# Patient Record
Sex: Male | Born: 1990 | Race: White | Hispanic: No | Marital: Single | Smoking: Current every day smoker
Health system: Southern US, Community
[De-identification: ages and names within clinical notes are randomized; demographics above are authoritative.]

## PROBLEM LIST (undated history)

## (undated) DIAGNOSIS — Z789 Other specified health status: Secondary | ICD-10-CM

## (undated) DIAGNOSIS — Z720 Tobacco use: Secondary | ICD-10-CM

## (undated) DIAGNOSIS — Z7289 Other problems related to lifestyle: Secondary | ICD-10-CM

---
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
## 2012-02-17 ENCOUNTER — Emergency Department: Payer: Self-pay | Admitting: Emergency Medicine

## 2012-02-17 ENCOUNTER — Emergency Department: Payer: Self-pay | Admitting: *Deleted

## 2014-09-25 ENCOUNTER — Emergency Department: Payer: Self-pay | Admitting: Emergency Medicine

## 2014-11-21 ENCOUNTER — Emergency Department
Admit: 2014-11-21 | Disposition: A | Discharge status: Home / Self Care | Payer: Self-pay | Admitting: Emergency Medicine

## 2014-11-21 LAB — CBC WITH DIFFERENTIAL/PLATELET
BASOS PCT: 0.4 %
Basophil #: 0 10*3/uL (ref 0.0–0.1)
Eosinophil #: 0.2 10*3/uL (ref 0.0–0.7)
Eosinophil %: 2.8 %
HCT: 43.4 % (ref 40.0–52.0)
HGB: 14.6 g/dL (ref 13.0–18.0)
LYMPHS PCT: 24.7 %
Lymphocyte #: 1.6 10*3/uL (ref 1.0–3.6)
MCH: 30.4 pg (ref 26.0–34.0)
MCHC: 33.7 g/dL (ref 32.0–36.0)
MCV: 90 fL (ref 80–100)
Monocyte #: 0.4 x10 3/mm (ref 0.2–1.0)
Monocyte %: 6.8 %
NEUTROS PCT: 65.3 %
Neutrophil #: 4.3 10*3/uL (ref 1.4–6.5)
Platelet: 298 10*3/uL (ref 150–440)
RBC: 4.81 10*6/uL (ref 4.40–5.90)
RDW: 12.6 % (ref 11.5–14.5)
WBC: 6.6 10*3/uL (ref 3.8–10.6)

## 2014-11-21 LAB — COMPREHENSIVE METABOLIC PANEL
ALK PHOS: 60 U/L
Albumin: 4.2 g/dL
Anion Gap: 7 (ref 7–16)
BILIRUBIN TOTAL: 0.4 mg/dL
BUN: 12 mg/dL
CHLORIDE: 104 mmol/L
CO2: 29 mmol/L
CREATININE: 0.77 mg/dL
Calcium, Total: 9.5 mg/dL
EGFR (African American): >60
Glucose: 115 mg/dL — ABNORMAL HIGH
POTASSIUM: 4 mmol/L
SGOT(AST): 29 U/L
SGPT (ALT): 20 U/L
SODIUM: 140 mmol/L
TOTAL PROTEIN: 7.6 g/dL

## 2014-11-21 LAB — URINALYSIS, COMPLETE
Bacteria: NONE SEEN
Bilirubin,UR: NEGATIVE
Blood: NEGATIVE
Glucose,UR: NEGATIVE mg/dL (ref 0–75)
KETONE: NEGATIVE
Leukocyte Esterase: NEGATIVE
Nitrite: NEGATIVE
PH: 6 (ref 4.5–8.0)
PROTEIN: NEGATIVE
RBC,UR: NONE SEEN /HPF (ref 0–5)
SPECIFIC GRAVITY: 1.014 (ref 1.003–1.030)
Squamous Epithelial: NONE SEEN
WBC UR: NONE SEEN /HPF (ref 0–5)

## 2014-12-11 ENCOUNTER — Emergency Department
Admit: 2014-12-11 | Disposition: A | Discharge status: Home / Self Care | Payer: Self-pay | Admitting: Emergency Medicine

## 2015-10-19 ENCOUNTER — Encounter: Payer: Self-pay | Admitting: Emergency Medicine

## 2015-10-19 ENCOUNTER — Emergency Department
Admission: EM | Admit: 2015-10-19 | Discharge: 2015-10-19 | Disposition: A | Discharge status: Home / Self Care | Payer: Worker's Compensation | Attending: Emergency Medicine | Admitting: Emergency Medicine

## 2015-10-19 ENCOUNTER — Emergency Department: Payer: Worker's Compensation

## 2015-10-19 DIAGNOSIS — S62617B Displaced fracture of proximal phalanx of left little finger, initial encounter for open fracture: Secondary | ICD-10-CM | POA: Diagnosis not present

## 2015-10-19 DIAGNOSIS — Y9389 Activity, other specified: Secondary | ICD-10-CM | POA: Insufficient documentation

## 2015-10-19 DIAGNOSIS — W312XXA Contact with powered woodworking and forming machines, initial encounter: Secondary | ICD-10-CM | POA: Diagnosis not present

## 2015-10-19 DIAGNOSIS — Y9289 Other specified places as the place of occurrence of the external cause: Secondary | ICD-10-CM | POA: Diagnosis not present

## 2015-10-19 DIAGNOSIS — S62609B Fracture of unspecified phalanx of unspecified finger, initial encounter for open fracture: Secondary | ICD-10-CM

## 2015-10-19 DIAGNOSIS — F17210 Nicotine dependence, cigarettes, uncomplicated: Secondary | ICD-10-CM | POA: Insufficient documentation

## 2015-10-19 DIAGNOSIS — Y990 Civilian activity done for income or pay: Secondary | ICD-10-CM | POA: Diagnosis not present

## 2015-10-19 DIAGNOSIS — S61217A Laceration without foreign body of left little finger without damage to nail, initial encounter: Secondary | ICD-10-CM | POA: Diagnosis present

## 2015-10-19 DIAGNOSIS — IMO0002 Reserved for concepts with insufficient information to code with codable children: Secondary | ICD-10-CM

## 2015-10-19 MED ORDER — LIDOCAINE HCL (PF) 1 % IJ SOLN
INTRAMUSCULAR | Status: AC
  Administered 2015-10-19: 04:00:00
  Filled 2015-10-19: qty 5

## 2015-10-19 MED ORDER — CEPHALEXIN 500 MG PO CAPS: 500.0000 mg | ORAL_CAPSULE | Freq: Four times a day (QID) | ORAL | Status: DC

## 2015-10-19 MED ORDER — CEPHALEXIN 500 MG PO CAPS
500.0000 mg | ORAL_CAPSULE | Freq: Once | ORAL | Status: AC
  Administered 2015-10-19: 500 mg via ORAL
  Filled 2015-10-19: qty 1

## 2015-10-19 NOTE — ED Notes (Signed)
Pt discharged to home.  Friend driving.  Discharge instructions reviewed.  Verbalized understanding.  No questions or concerns at this time.  Teach back verified.  Pt in NAD.  No items left in ED.   

## 2015-10-19 NOTE — ED Notes (Signed)
Pt presents to ED with laceration to his left hand on the 5th digit. 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 States he does not want to file workman's comp. Holding towel to laceration at this time. Bleeding not controlled at this time.

## 2015-10-19 NOTE — Discharge Instructions (Signed)
1. Take antibiotic as prescribed (Keflex 500 mg 4 times daily 7 days). 2. Suture removal in 7-10 days. 3. Return to the ER for worsening symptoms, increased redness, swelling, purulent discharge or other concerns.  Finger Fracture Fractures of fingers are breaks in the bones of the fingers. There are many types of fractures. There are different ways of treating these fractures. Your health care provider will discuss the best way to treat your fracture. CAUSES Traumatic injury is the main cause of broken fingers. These include:  Injuries while playing sports.  Workplace injuries.  Falls. RISK FACTORS Activities that can increase your risk of finger fractures include:  Sports.  Workplace activities that involve machinery.  A condition called osteoporosis, which can make your bones less dense and cause them to fracture more easily. SIGNS AND SYMPTOMS The main symptoms of a broken finger are pain and swelling within 15 minutes after the injury. Other symptoms include:  Bruising of your finger.  Stiffness of your finger.  Numbness of your finger.  Exposed bones (compound fracture) if the fracture is severe. DIAGNOSIS  The best way to diagnose a broken bone is with X-ray imaging. Additionally, your health care provider will use this X-ray image to evaluate the position of the broken finger bones.  TREATMENT  Finger fractures can be treated with:   Nonreduction--This means the bones are in place. The finger is splinted without changing the positions of the bone pieces. The splint is usually left on for about a week to 10 days. This will depend on your fracture and what your health care provider thinks.  Closed reduction--The bones are put back into position without using surgery. The finger is then splinted.  Open reduction and internal fixation--The fracture site is opened. Then the bone pieces are fixed into place with pins or some type of hardware. This is seldom required. It  depends on the severity of the fracture. HOME CARE INSTRUCTIONS   Follow your health care provider's instructions regarding activities, exercises, and physical therapy.  Only take over-the-counter or prescription medicines for pain, discomfort, or fever as directed by your health care provider. SEEK MEDICAL CARE IF: You have pain or swelling that limits the motion or use of your fingers. SEEK IMMEDIATE MEDICAL CARE IF:  Your finger becomes numb. MAKE SURE YOU:   Understand these instructions.  Will watch your condition.  Will get help right away if you are not doing well or get worse.   This information is not intended to replace advice given to you by your health care provider. Make sure you discuss any questions you have with your health care provider.   Document Released: 11/12/2000 Document Revised: 05/21/2013 Document Reviewed: 03/12/2013 Elsevier Interactive Patient Education [employer]2016 Elsevier Inc.

## 2015-10-19 NOTE — ED Provider Notes (Signed)
[facility] Emergency Department Provider Note  ____________________________________________  Time seen: Approximately 2:37 AM  I have reviewed the triage vital signs and the nursing notes.   HISTORY  Chief Complaint Extremity Laceration    HPI Brent Stone is a 25 y.o. male who presents to the ED from work for left finger laceration. Patient is left-handed, states he was cutting yarn on a small saw blade and slipped. Tetanus is up-to-date. Complains of pain on movement and bleeding. Denies numbness/tingling. Voices no other medical complaints.   Past medical history None  There are no active problems to display for this patient.   History reviewed. No pertinent past surgical history.  No current outpatient prescriptions on file.  Allergies Review of patient's allergies indicates no known allergies.  No family history on file.  Social History Social History  Substance Use Topics  . Smoking status: Current Every Day Smoker -- 1.00 packs/day    Types: Cigarettes  . Smokeless tobacco: None  . Alcohol Use: Yes    Review of Systems  Constitutional: No fever/chills. Eyes: No visual changes. ENT: No sore throat. Cardiovascular: Denies chest pain. Respiratory: Denies shortness of breath. Gastrointestinal: No abdominal pain.  No nausea, no vomiting.  No diarrhea.  No constipation. Genitourinary: Negative for dysuria. Musculoskeletal: Positive for left finger laceration. Negative for back pain. Skin: Negative for rash. Neurological: Negative for headaches, focal weakness or numbness.  10-point ROS otherwise negative.  ____________________________________________   PHYSICAL EXAM:  VITAL SIGNS: ED Triage Vitals  Enc Vitals Group     BP 10/19/15 0213 127/112 mmHg     Pulse Rate 10/19/15 0213 85     Resp 10/19/15 0213 18     Temp 10/19/15 0213 97.7 F (36.5 C)     Temp Source 10/19/15 0213 Oral     SpO2 10/19/15 0213 98 %     Weight  10/19/15 0213 170 lb (77.111 kg)     Height 10/19/15 0213 5\' 6"  (1.676 m)     Head Cir --      Peak Flow --      Pain Score 10/19/15 0213 6     Pain Loc --      Pain Edu? --      Excl. in GC? --     Constitutional: Alert and oriented. Well appearing and in mild acute distress. Eyes: Conjunctivae are normal. PERRL. EOMI. Head: Atraumatic. Nose: No congestion/rhinnorhea. Mouth/Throat: Mucous membranes are moist.  Oropharynx non-erythematous. Neck: No stridor.   Cardiovascular: Normal rate, regular rhythm. Grossly normal heart sounds.  Good peripheral circulation. Respiratory: Normal respiratory effort.  No retractions. Lungs CTAB. Gastrointestinal: Soft and nontender. No distention. No abdominal bruits. No CVA tenderness. Musculoskeletal:  Left Ring finger: Approximately 1 cm curve, deep laceration which involves dorsal and medial finger beneath IP joint. Does not actually involve the joint. Venous bleeding. No tendon visualized. No arterial bleeding. Full range of motion. Brisk, less than 5 second capillary refill. 2+ radial pulses. Neurologic:  Normal speech and language. No gross focal neurologic deficits are appreciated. No gait instability. Skin:  Skin is warm, dry and intact. No rash noted. Psychiatric: Mood and affect are normal. Speech and behavior are normal.  ____________________________________________   LABS (all labs ordered are listed, but only abnormal results are displayed)  Labs Reviewed - No data to display ____________________________________________  EKG  None ____________________________________________  RADIOLOGY  Left Ring finger x-ray (viewed by me, interpreted per Dr. Manus GunningEhinger): Soft tissue laceration with minimally displaced  and comminuted fracture of the fifth proximal phalanx. ____________________________________________   PROCEDURES  Procedure(s) performed:   LACERATION REPAIR Performed by: Irean Hong Authorized by: Irean Hong Consent:  Verbal consent obtained. Risks and benefits: risks, benefits and alternatives were discussed Consent given by: patient Patient identity confirmed: provided demographic data Prepped and Draped in normal sterile fashion Wound explored  Laceration Location: left 5th digit  Laceration Length: 1cm  No Foreign Bodies seen or palpated  Anesthesia: local infiltration  Local anesthetic: lidocaine 1% w/o epinephrine  Anesthetic total: 6 ml  Irrigation method: syringe; copious irrigation Amount of cleaning: standard  Skin closure: 4-0 nylon  Number of sutures: continuous  Technique: standard sterile  Patient tolerance: Patient tolerated the procedure well with no immediate complications.  Surgicel and direct pressure applied to wound after suturing secondary to continued venous oozing. Removed approximately 5 minutes later with good hemostasis; no active bleeding.  Critical Care performed: No  ____________________________________________   INITIAL IMPRESSION / ASSESSMENT AND PLAN / ED COURSE  Pertinent labs & imaging results that were available during my care of the patient were reviewed by me and considered in my medical decision making (see chart for details).  25 year old male who presents with left ring finger laceration secondary to saw blade. Patient was extensively irrigated after digital block and sutured; tolerated procedure well with good hemostasis. Will place on Keflex and follow-up with orthopedics. Strict return precautions given. Patient verbalizes understanding and agrees with plan of care. ____________________________________________   FINAL CLINICAL IMPRESSION(S) / ED DIAGNOSES  Final diagnoses:  Laceration  Finger fracture, left, open, initial encounter      Irean Hong, MD 10/19/15 [phone]

## 2015-10-20 ENCOUNTER — Emergency Department
Admission: EM | Admit: 2015-10-20 | Disposition: A | Discharge status: Home / Self Care | Payer: Self-pay | Source: Home / Self Care

## 2015-10-20 NOTE — ED Notes (Signed)
Went to the waiting room to get the patient and was told he was sent to outpatient. Patient moved back into the waiting room.

## 2015-10-22 ENCOUNTER — Emergency Department
Admission: EM | Admit: 2015-10-22 | Discharge: 2015-10-22 | Disposition: A | Discharge status: Home / Self Care | Payer: Worker's Compensation | Attending: Emergency Medicine | Admitting: Emergency Medicine

## 2015-10-22 ENCOUNTER — Encounter: Payer: Self-pay | Admitting: Emergency Medicine

## 2015-10-22 DIAGNOSIS — Z5189 Encounter for other specified aftercare: Secondary | ICD-10-CM

## 2015-10-22 DIAGNOSIS — Z4801 Encounter for change or removal of surgical wound dressing: Secondary | ICD-10-CM | POA: Insufficient documentation

## 2015-10-22 DIAGNOSIS — Z760 Encounter for issue of repeat prescription: Secondary | ICD-10-CM | POA: Diagnosis present

## 2015-10-22 DIAGNOSIS — F17210 Nicotine dependence, cigarettes, uncomplicated: Secondary | ICD-10-CM | POA: Diagnosis not present

## 2015-10-22 MED ORDER — TRAMADOL HCL 50 MG PO TABS: 50.0000 mg | ORAL_TABLET | Freq: Two times a day (BID) | ORAL | Status: DC

## 2015-10-22 NOTE — ED Provider Notes (Signed)
[facility] Emergency Department Provider Note ____________________________________________  Time seen: 1723  I have reviewed the triage vital signs and the nursing notes.  HISTORY  Chief Complaint  Medication Refill  HPI Brent Stone is a 25 y.o. male presents to the ED 3 days status post evaluation on the date of injury where he sustained a left pinky laceration and open fracture to the same following a work injury. He was treated with suture repair and wound dressing. He was discharged with a prescription for Keflex for management of the open fracture. Patient does endorse that he is scheduled to see orthopedics on Monday at 3:15 in the afternoon. He reports he's been dosing the Keflex as prescribed, and has been dosing appropriate without significant pain relief. He is here today requesting a prescription for pain management.He denies any wound drainage or interim fever chills or sweats. He does admit that he was unable to return to work the last day or two secondary to his pain. Patient has dressed the wound with a gauze and 2 rubber bands, citing he ran out of the tape that was used to dress the wound originally. He rates his pain a 9/10 in triage.  History reviewed. No pertinent past medical history.  There are no active problems to display for this patient.   History reviewed. No pertinent past surgical history.  Current Outpatient Rx  Name  Route  Sig  Dispense  Refill  . cephALEXin (KEFLEX) 500 MG capsule   Oral   Take 1 capsule (500 mg total) by mouth 4 (four) times daily.   28 capsule   0   . traMADol (ULTRAM) 50 MG tablet   Oral   Take 1 tablet (50 mg total) by mouth 2 (two) times daily.   10 tablet   0    Allergies Review of patient's allergies indicates no known allergies.  No family history on file.  Social History Social History  Substance Use Topics  . Smoking status: Current Every Day Smoker -- 1.00 packs/day    Types:  Cigarettes  . Smokeless tobacco: None  . Alcohol Use: Yes   Review of Systems  Constitutional: Negative for fever. Cardiovascular: Negative for chest pain. Respiratory: Negative for shortness of breath. Musculoskeletal: Negative for back pain. Left pinky pain as above Skin: Negative for rash. Neurological: Negative for headaches, focal weakness or numbness. ____________________________________________  PHYSICAL EXAM:  VITAL SIGNS: ED Triage Vitals  Enc Vitals Group     BP 10/22/15 1642 151/85 mmHg     Pulse Rate 10/22/15 1642 101     Resp 10/22/15 1642 18     Temp 10/22/15 1642 97.9 F (36.6 C)     Temp Source 10/22/15 1642 Oral     SpO2 10/22/15 1642 98 %     Weight 10/22/15 1642 170 lb (77.111 kg)     Height 10/22/15 1642 5\' 6"  (1.676 m)     Head Cir --      Peak Flow --      Pain Score 10/22/15 1642 9     Pain Loc --      Pain Edu? --      Excl. in GC? --    Constitutional: Alert and oriented. Well appearing and in no distress. Head: Normocephalic and atraumatic. Eyes: Conjunctivae are normal. PERRL. Normal extraocular movements Hematological/Lymphatic/Immunological: No cervical lymphadenopathy. Cardiovascular: Normal rate, regular rhythm. Normal distal pulses normal capillary refill. Respiratory: Normal respiratory effort.  Musculoskeletal: Left pinky with normal flexion  and extension range on exam. Patient with a healing linear laceration to the palmar & lateral aspect of the left pinky. No wound dehiscence is appreciated, black nylon sutures are intact. Nontender with normal range of motion in all extremities.  Neurologic: Normal gross sensation. Cranial nerves II through XII grossly intact. Normal gait without ataxia. Normal speech and language. No gross focal neurologic deficits are appreciated. Skin:  Skin is warm, dry and intact. No rash noted. Psychiatric: Mood and affect are normal. Patient exhibits appropriate insight and  judgment. ____________________________________________  PROCEDURES  Wound dressing  Finger splint ____________________________________________  INITIAL IMPRESSION / ASSESSMENT AND PLAN / ED COURSE  Patient returns for a wound check 3 days status post left pinky laceration status post suture repair. The wound was again cleansed and dressed with a nonstick dressing and a finger splint for support. Patient will follow-up with orthopedics as scheduled for Monday afternoon. He continued As previously prescribed. He is also given a prescription for Ultram #10 to dose as needed for pain. Again were reviewed be released to modified duty is available for left hand use as tolerated, and instructions on keeping the wound, splint, and dressing clean and dry. ____________________________________________  FINAL CLINICAL IMPRESSION(S) / ED DIAGNOSES  Final diagnoses:  Visit for wound check      Lissa Hoard, PA-C 10/22/15 1753  Lissa Hoard, PA-C 10/22/15 1753  Governor Rooks, MD 10/22/15 2057

## 2015-10-22 NOTE — Discharge Instructions (Signed)
Wound Care Taking care of your wound properly can help to prevent pain and infection. It can also help your wound to heal more quickly.  HOW TO CARE FOR YOUR WOUND  Take or apply over-the-counter and prescription medicines only as told by your health care provider.  If you were prescribed antibiotic medicine, take or apply it as told by your health care provider. Do not stop using the antibiotic even if your condition improves.  Clean the wound each day or as told by your health care provider.  Wash the wound with mild soap and water.  Rinse the wound with water to remove all soap.  Pat the wound dry with a clean towel. Do not rub it.  There are many different ways to close and cover a wound. For example, a wound can be covered with stitches (sutures), skin glue, or adhesive strips. Follow instructions from your health care provider about:  How to take care of your wound.  When and how you should change your bandage (dressing).  When you should remove your dressing.  Removing whatever was used to close your wound.  Check your wound every day for signs of infection. Watch for:  Redness, swelling, or pain.  Fluid, blood, or pus.  Keep the dressing dry until your health care provider says it can be removed. Do not take baths, swim, use a hot tub, or do anything that would put your wound underwater until your health care provider approves.  Raise (elevate) the injured area above the level of your heart while you are sitting or lying down.  Do not scratch or pick at the wound.  Keep all follow-up visits as told by your health care provider. This is important. SEEK MEDICAL CARE IF:  You received a tetanus shot and you have swelling, severe pain, redness, or bleeding at the injection site.  You have a fever.  Your pain is not controlled with medicine.  You have increased redness, swelling, or pain at the site of your wound.  You have fluid, blood, or pus coming from your  wound.  You notice a bad smell coming from your wound or your dressing. SEEK IMMEDIATE MEDICAL CARE IF:  You have a red streak going away from your wound.   This information is not intended to replace advice given to you by your health care provider. Make sure you discuss any questions you have with your health care provider.   Document Released: 05/09/2008 Document Revised: 12/15/2014 Document Reviewed: 07/27/2014 Elsevier Interactive Patient Education [employer]   Your wound is healing well. Keep the wound and splint clean and dry. Follow-up with ortho as planned on Monday. Continue the antibiotic as directed. Take the pain medicine as needed.

## 2015-10-22 NOTE — ED Notes (Signed)
Patient was seen here earlier this week for laceration to left 5th finger. Patient had 8-9 stitches placed. 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 Patient wanted to be seen for pain control related to laceration.

## 2015-12-29 ENCOUNTER — Encounter: Payer: Self-pay | Admitting: Emergency Medicine

## 2015-12-29 ENCOUNTER — Emergency Department
Admission: EM | Admit: 2015-12-29 | Discharge: 2015-12-29 | Disposition: A | Discharge status: Home / Self Care | Payer: Self-pay | Attending: Emergency Medicine | Admitting: Emergency Medicine

## 2015-12-29 DIAGNOSIS — F17210 Nicotine dependence, cigarettes, uncomplicated: Secondary | ICD-10-CM | POA: Insufficient documentation

## 2015-12-29 DIAGNOSIS — J309 Allergic rhinitis, unspecified: Secondary | ICD-10-CM | POA: Insufficient documentation

## 2015-12-29 MED ORDER — LORATADINE 10 MG PO TABS
10.0000 mg | ORAL_TABLET | Freq: Every day | ORAL | Status: DC
Start: 2015-12-29 — End: 2016-05-28

## 2015-12-29 MED ORDER — FLUTICASONE PROPIONATE 50 MCG/ACT NA SUSP
2.0000 | Freq: Every day | NASAL | Status: DC
Start: 2015-12-29 — End: 2016-05-28

## 2015-12-29 NOTE — Discharge Instructions (Signed)
Hay Fever Hay fever is an allergic reaction to particles in the air. It cannot be passed from person to person. It cannot be cured, but it can be controlled. CAUSES  Hay fever is caused by something that triggers an allergic reaction (allergens). The following are examples of allergens:  Ragweed.  Feathers.  Animal dander.  Grass and tree pollens.  Cigarette smoke.  House dust.  Pollution. SYMPTOMS   Sneezing.  Runny or stuffy nose.  Tearing eyes.  Itchy eyes, nose, mouth, throat, skin, or other area.  Sore throat.  Headache.  Decreased sense of smell or taste. DIAGNOSIS Your caregiver will perform a physical exam and ask questions about the symptoms you are having.Allergy testing may be done to determine exactly what triggers your hay fever.  TREATMENT   Over-the-counter medicines may help symptoms. These include:  Antihistamines.  Decongestants. These may help with nasal congestion.  Your caregiver may prescribe medicines if over-the-counter medicines do not work.  Some people benefit from allergy shots when other medicines are not helpful. HOME CARE INSTRUCTIONS   Avoid the allergen that is causing your symptoms, if possible.  Take all medicine as told by your caregiver. SEEK MEDICAL CARE IF:   You have severe allergy symptoms and your current medicines are not helping.  Your treatment was working at one time, but you are now experiencing symptoms.  You have sinus congestion and pressure.  You develop a fever or headache.  You have thick nasal discharge.  You have asthma and have a worsening cough and wheezing. SEEK IMMEDIATE MEDICAL CARE IF:   You have swelling of your tongue or lips.  You have trouble breathing.  You feel lightheaded or like you are going to faint.  You have cold sweats.  You have a fever.   This information is not intended to replace advice given to you by your health care provider. Make sure you discuss any  questions you have with your health care provider.   Document Released: 07/31/2005 Document Revised: 10/23/2011 Document Reviewed: 02/10/2015 Elsevier Interactive Patient Education 2016 Elsevier Inc.  

## 2015-12-29 NOTE — ED Notes (Signed)
Pt with cold sx started yesterday.

## 2015-12-29 NOTE — ED Notes (Signed)
States he developed some body aches and weakness for a couple of days   No fever  Or n/v  Lungs clear and abd soft    Afebrile on arrival

## 2015-12-29 NOTE — ED Provider Notes (Signed)
[facility] Emergency Department Provider Note  ____________________________________________  Time seen: Approximately 11:48 AM  I have reviewed the triage vital signs and the nursing notes.   HISTORY  Chief Complaint URI    HPI Brent Stone is a 25 y.o. male , NAD, presents to the emergency room 1 day history of sneezing, nasal congestion, runny nose. States he felt some body aches today. Took NyQuil last night without any alleviation of symptoms. Has not taken anything else over-the-counter for his symptoms. Has not had any fever, chills, nausea, vomiting, abdominal pain.Denies ear pain, sore throat, headache, sinus pressure or pain.   History reviewed. No pertinent past medical history.  There are no active problems to display for this patient.   History reviewed. No pertinent past surgical history.  Current Outpatient Rx  Name  Route  Sig  Dispense  Refill  . fluticasone (FLONASE) 50 MCG/ACT nasal spray   Each Nare   Place 2 sprays into both nostrils daily.   16 g   0   . loratadine (CLARITIN) 10 MG tablet   Oral   Take 1 tablet (10 mg total) by mouth daily.   30 tablet   0     Allergies Review of patient's allergies indicates no known allergies.  No family history on file.  Social History Social History  Substance Use Topics  . Smoking status: Current Every Day Smoker -- 1.00 packs/day    Types: Cigarettes  . Smokeless tobacco: None  . Alcohol Use: Yes     Review of Systems  Constitutional: No fever/chills Eyes: No visual changes. No discharge, Redness, swelling ENT: Positive nasal congestion, runny nose, sneezing. No sore throat, ear pain, sinus pressure. Cardiovascular: No chest pain. Respiratory: No cough, chest congestion. No shortness of breath. No wheezing.  Gastrointestinal: No abdominal pain.  No nausea, vomiting.  No diarrhea.   Musculoskeletal: Positive general myalgias.  Skin: Negative for rash. Neurological:  Negative for headaches, focal weakness or numbness. 10-point ROS otherwise negative.  ____________________________________________   PHYSICAL EXAM:  VITAL SIGNS: ED Triage Vitals  Enc Vitals Group     BP 12/29/15 1133 126/84 mmHg     Pulse Rate 12/29/15 1133 74     Resp 12/29/15 1133 20     Temp 12/29/15 1133 97.8 F (36.6 C)     Temp Source 12/29/15 1133 Oral     SpO2 12/29/15 1133 100 %     Weight 12/29/15 1133 170 lb (77.111 kg)     Height 12/29/15 1133 5\' 6"  (1.676 m)     Head Cir --      Peak Flow --      Pain Score --      Pain Loc --      Pain Edu? --      Excl. in GC? --      Constitutional: Alert and oriented. Well appearing and in no acute distress. Eyes: Conjunctivae are normal. PERRLA. EOMI without pain.  Head: Atraumatic. ENT:      Ears: TMs could not be visualized due to dark, dry, impacted cerumen in bilateral canals.      Nose: Mild congestion with moderate clear rhinnorhea. Turbinates are injected.      Mouth/Throat: Mucous membranes are moist. Pharynx without erythema, swelling, exudate. Clear postnasal drip. Neck:  Supple with full range of motion Hematological/Lymphatic/Immunilogical: No cervical lymphadenopathy. Cardiovascular: Normal rate, regular rhythm. Normal S1 and S2.  Good peripheral circulation. Respiratory: Normal respiratory effort without tachypnea or retractions.  Lungs CTAB with breath sounds noted in all lung fields. Neurologic:  Normal speech and language. No gross focal neurologic deficits are appreciated.  Skin:  Skin is warm, dry and intact. No rash noted. Psychiatric: Mood and affect are normal. Speech and behavior are normal. Patient exhibits appropriate insight and judgement.   ____________________________________________    LABS  None ____________________________________________  EKG  None ____________________________________________  RADIOLOGY  None ____________________________________________    PROCEDURES  Procedure(s) performed: None    Medications - No data to display   ____________________________________________   INITIAL IMPRESSION / ASSESSMENT AND PLAN / ED COURSE  Patient's diagnosis is consistent with allergic rhinitis. Patient will be discharged home with prescriptions for Flonase and Claritin to take as directed. Patient may take Tylenol or ibuprofen as needed for aches. Patient is to follow up with a local outpatient primary care if symptoms persist past this treatment course. Patient was given information in regards to [facility] [place], [facility] and the [facility] to establish care. Patient is given ED precautions to return to the ED for any worsening or new symptoms.    ____________________________________________  FINAL CLINICAL IMPRESSION(S) / ED DIAGNOSES  Final diagnoses:  Allergic rhinitis, unspecified allergic rhinitis type      NEW MEDICATIONS STARTED DURING THIS VISIT:  Discharge Medication List as of 12/29/2015 11:57 AM    START taking these medications   Details  fluticasone (FLONASE) 50 MCG/ACT nasal spray Place 2 sprays into both nostrils daily., Starting 12/29/2015, Until Discontinued, Print    loratadine (CLARITIN) 10 MG tablet Take 1 tablet (10 mg total) by mouth daily., Starting 12/29/2015, Until Discontinued, Print             Hope Pigeon, PA-C 12/29/15 1209  Myrna Blazer, MD 12/29/15 [phone]

## 2016-01-28 ENCOUNTER — Emergency Department: Payer: Self-pay

## 2016-01-28 ENCOUNTER — Encounter: Payer: Self-pay | Admitting: Emergency Medicine

## 2016-01-28 ENCOUNTER — Emergency Department
Admission: EM | Admit: 2016-01-28 | Discharge: 2016-01-28 | Disposition: A | Discharge status: Home / Self Care | Payer: Self-pay | Attending: Emergency Medicine | Admitting: Emergency Medicine

## 2016-01-28 DIAGNOSIS — F17210 Nicotine dependence, cigarettes, uncomplicated: Secondary | ICD-10-CM | POA: Insufficient documentation

## 2016-01-28 DIAGNOSIS — M25561 Pain in right knee: Secondary | ICD-10-CM | POA: Insufficient documentation

## 2016-01-28 MED ORDER — IBUPROFEN 800 MG PO TABS: 800.0000 mg | ORAL_TABLET | Freq: Three times a day (TID) | ORAL | Status: DC | PRN
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
## 2016-01-28 NOTE — Discharge Instructions (Signed)
Knee Pain Knee pain is a very common symptom and can have many causes. Knee pain often goes away when you follow your health care provider's instructions for relieving pain and discomfort at home. However, knee pain can develop into a condition that needs treatment. Some conditions may include:  Arthritis caused by wear and tear (osteoarthritis).  Arthritis caused by swelling and irritation (rheumatoid arthritis or gout).  A cyst or growth in your knee.  An infection in your knee joint.  An injury that will not heal.  Damage, swelling, or irritation of the tissues that support your knee (torn ligaments or tendinitis). If your knee pain continues, additional tests may be ordered to diagnose your condition. Tests may include X-rays or other imaging studies of your knee. You may also need to have fluid removed from your knee. Treatment for ongoing knee pain depends on the cause, but treatment may include:  Medicines to relieve pain or swelling.  Steroid injections in your knee.  Physical therapy.  Surgery. HOME CARE INSTRUCTIONS  Take medicines only as directed by your health care provider.  Rest your knee and keep it raised (elevated) while you are resting.  Do not do things that cause or worsen pain.  Avoid high-impact activities or exercises, such as running, jumping rope, or doing jumping jacks.  Apply ice to the knee area:  Put ice in a plastic bag.  Place a towel between your skin and the bag.  Leave the ice on for 20 minutes, 2-3 times a day.  Ask your health care provider if you should wear an elastic knee support.  Keep a pillow under your knee when you sleep.  Lose weight if you are overweight. Extra weight can put pressure on your knee.  Do not use any tobacco products, including cigarettes, chewing tobacco, or electronic cigarettes. If you need help quitting, ask your health care provider. Smoking may slow the healing of any bone and joint problems that you may  have. SEEK MEDICAL CARE IF:  Your knee pain continues, changes, or gets worse.  You have a fever along with knee pain.  Your knee buckles or locks up.  Your knee becomes more swollen. SEEK IMMEDIATE MEDICAL CARE IF:   Your knee joint feels hot to the touch.  You have chest pain or trouble breathing.   This information is not intended to replace advice given to you by your health care provider. Make sure you discuss any questions you have with your health care provider.   Document Released: 05/28/2007 Document Revised: 08/21/2014 Document Reviewed: 03/16/2014 Elsevier Interactive Patient Education 2016 [employer]   You can take ibuprofen as needed for knee pain. It is unclear what is triggering the pain. It is likely due to a recurrent knee strain.  You  may purchase a knee brace from a local pharmacy for support. He can also follow-up with the orthopedist for further evaluation.

## 2016-02-25 ENCOUNTER — Emergency Department
Admission: EM | Admit: 2016-02-25 | Discharge: 2016-02-25 | Disposition: A | Discharge status: Home / Self Care | Payer: Self-pay | Attending: Emergency Medicine | Admitting: Emergency Medicine

## 2016-02-25 DIAGNOSIS — T148XXA Other injury of unspecified body region, initial encounter: Secondary | ICD-10-CM

## 2016-02-25 DIAGNOSIS — Y929 Unspecified place or not applicable: Secondary | ICD-10-CM | POA: Insufficient documentation

## 2016-02-25 DIAGNOSIS — S61001A Unspecified open wound of right thumb without damage to nail, initial encounter: Secondary | ICD-10-CM | POA: Insufficient documentation

## 2016-02-25 DIAGNOSIS — W260XXA Contact with knife, initial encounter: Secondary | ICD-10-CM | POA: Insufficient documentation

## 2016-02-25 DIAGNOSIS — Y9389 Activity, other specified: Secondary | ICD-10-CM | POA: Insufficient documentation

## 2016-02-25 DIAGNOSIS — F17210 Nicotine dependence, cigarettes, uncomplicated: Secondary | ICD-10-CM | POA: Insufficient documentation

## 2016-02-25 DIAGNOSIS — Y999 Unspecified external cause status: Secondary | ICD-10-CM | POA: Insufficient documentation

## 2016-02-25 NOTE — ED Notes (Signed)
Pt states Tuesday he cut his rt thumb with a knife, pt states that he is concerned that it may be infected due to the drainage

## 2016-02-25 NOTE — ED Provider Notes (Signed)
CSN: [id number]     Arrival date & time 02/25/16  1701 History   First MD Initiated Contact with Patient 02/25/16 1718     Chief Complaint  Patient presents with  . Laceration     (Consider location/radiation/quality/duration/timing/severity/associated sxs/prior Treatment) HPI  25 year old male presents for his father for evaluation of right thumb soft tissue avulsion. Patient states 3 days ago he was cutting vegetables, cut the tip of his thumb. Patient placed a Band-Aid to the area and has been keeping it covered clean and dry. Denies any warmth erythema or drainage. No tingling. Patient states his employer made him come in today to have it checked out before he could return to work. His tetanus is up-to-date. He is not taking any medications for pain. Pain is 0 out of 10.  No past medical history on file. No past surgical history on file. No family history on file. Social History  Substance Use Topics  . Smoking status: Current Every Day Smoker -- 1.00 packs/day    Types: Cigarettes  . Smokeless tobacco: Not on file  . Alcohol Use: Yes    Review of Systems  Constitutional: Negative.  Negative for fever, chills, activity change and appetite change.  HENT: Negative for congestion, ear pain, mouth sores, rhinorrhea, sinus pressure, sore throat and trouble swallowing.   Eyes: Negative for photophobia, pain and discharge.  Respiratory: Negative for cough, chest tightness and shortness of breath.   Cardiovascular: Negative for chest pain and leg swelling.  Gastrointestinal: Negative for nausea, vomiting, abdominal pain, diarrhea and abdominal distention.  Genitourinary: Negative for dysuria and difficulty urinating.  Musculoskeletal: Negative for back pain, arthralgias and gait problem.  Skin: Positive for wound. Negative for color change and rash.  Neurological: Negative for dizziness and headaches.  Hematological: Negative for adenopathy.  Psychiatric/Behavioral: Negative for  behavioral problems and agitation.      Allergies  Review of patient's allergies indicates no known allergies.  Home Medications   Prior to Admission medications   Medication Sig Start Date End Date Taking? Authorizing Provider  fluticasone (FLONASE) 50 MCG/ACT nasal spray Place 2 sprays into both nostrils daily. 12/29/15   Jami L Hagler, PA-C  ibuprofen (ADVIL,MOTRIN) 800 MG tablet Take 1 tablet (800 mg total) by mouth every 8 (eight) hours as needed. 01/28/16   Ignacia Bayley, PA-C  loratadine (CLARITIN) 10 MG tablet Take 1 tablet (10 mg total) by mouth daily. 12/29/15   Jami L Hagler, PA-C   BP 136/85 mmHg  Pulse 105  Temp(Src) 98.8 F (37.1 C) (Oral)  Resp 18  Ht  (1.676 m)  Wt 77.111 kg  BMI 27.45 kg/m2  SpO2 95% Physical Exam  Constitutional: He is oriented to person, place, and time. He appears well-developed and well-nourished.  HENT:  Head: Normocephalic and atraumatic.  Eyes: Conjunctivae and EOM are normal. Pupils are equal, round, and reactive to light.  Neck: Normal range of motion. Neck supple.  Cardiovascular: Normal rate and intact distal pulses.   Pulmonary/Chest: Effort normal. No respiratory distress.  Musculoskeletal:  Examination of the right hand shows patient has full composite fist. There is no swelling warmth erythema. Tip of the right thumb has a small 0.5 cm soft tissue avulsion with no drainage. There is no nail injury. No tendon deficits noted.  Neurological: He is alert and oriented to person, place, and time.  Skin: Skin is warm and dry.  Psychiatric: He has a normal mood and affect. His behavior is normal. Judgment and thought  content normal.    ED Course  Procedures (including critical care time) Labs Review Labs Reviewed - No data to display  Imaging Review No results found. I have personally reviewed and evaluated these images and lab results as part of my medical decision-making.   EKG Interpretation None      MDM   Final  diagnoses:  Soft tissue avulsion  25 year old male with soft tissue avulsion to the right thumb. Tetanus is up-to-date. No signs of infection. He will continue to clean and dry, recommend soaking and half water, peroxide 3 times daily, he is educated on signs and symptoms return to the ED for.    Evon Slackhomas C Larkin Morelos, PA-C 02/25/16 1757  Arnaldo NatalPaul F Malinda, MD 03/01/16 [phone]

## 2016-02-25 NOTE — Discharge Instructions (Signed)
Please soak right thumb and half peroxide, half warm water 3 times daily. Keep clean and dry. Return to the ER for any drainage warmth or redness.

## 2016-04-05 ENCOUNTER — Encounter: Payer: Self-pay | Admitting: Medical Oncology

## 2016-04-05 ENCOUNTER — Emergency Department
Admission: EM | Admit: 2016-04-05 | Discharge: 2016-04-05 | Disposition: A | Discharge status: Home / Self Care | Payer: Self-pay | Attending: Emergency Medicine | Admitting: Emergency Medicine

## 2016-04-05 DIAGNOSIS — M25561 Pain in right knee: Secondary | ICD-10-CM | POA: Insufficient documentation

## 2016-04-05 DIAGNOSIS — F17210 Nicotine dependence, cigarettes, uncomplicated: Secondary | ICD-10-CM | POA: Insufficient documentation

## 2016-04-05 DIAGNOSIS — Z791 Long term (current) use of non-steroidal anti-inflammatories (NSAID): Secondary | ICD-10-CM | POA: Insufficient documentation

## 2016-04-05 MED ORDER — NAPROXEN 500 MG PO TABS: 500.0000 mg | ORAL_TABLET | Freq: Two times a day (BID) | ORAL | 0 refills | Status: DC
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
## 2016-04-05 NOTE — ED Triage Notes (Signed)
Pt knee pain that is chronic but has worsened yesterday.
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
## 2016-04-05 NOTE — Discharge Instructions (Signed)
Advise elastic knee support until evaluation by orthopedics clinic.

## 2016-05-28 ENCOUNTER — Emergency Department
Admission: EM | Admit: 2016-05-28 | Discharge: 2016-05-28 | Disposition: A | Discharge status: Home / Self Care | Payer: Self-pay | Attending: Emergency Medicine | Admitting: Emergency Medicine

## 2016-05-28 DIAGNOSIS — J0100 Acute maxillary sinusitis, unspecified: Secondary | ICD-10-CM | POA: Insufficient documentation

## 2016-05-28 DIAGNOSIS — J301 Allergic rhinitis due to pollen: Secondary | ICD-10-CM | POA: Insufficient documentation

## 2016-05-28 DIAGNOSIS — F17210 Nicotine dependence, cigarettes, uncomplicated: Secondary | ICD-10-CM | POA: Insufficient documentation

## 2016-05-28 MED ORDER — PHENYLEPHRINE HCL 0.5 % NA SOLN
2.0000 [drp] | Freq: Once | NASAL | Status: AC
  Administered 2016-05-28: 2 [drp] via NASAL
  Filled 2016-05-28: qty 15

## 2016-05-28 MED ORDER — FLUTICASONE PROPIONATE 50 MCG/ACT NA SUSP: 2.0000 | Freq: Every day | NASAL | 0 refills | Status: DC

## 2016-05-28 MED ORDER — DEXAMETHASONE SODIUM PHOSPHATE 10 MG/ML IJ SOLN
10.0000 mg | Freq: Once | INTRAMUSCULAR | Status: AC
  Administered 2016-05-28: 10 mg via INTRAMUSCULAR
  Filled 2016-05-28: qty 1

## 2016-05-28 MED ORDER — CETIRIZINE-PSEUDOEPHEDRINE ER 5-120 MG PO TB12: 1.0000 | ORAL_TABLET | Freq: Two times a day (BID) | ORAL | 0 refills | Status: DC

## 2016-05-28 NOTE — ED Triage Notes (Signed)
Pt c/o sinus and chest congestion intermittent for the past couple weeks and in the past 2-3 days the sx having worsened with bodyaches

## 2016-05-28 NOTE — Discharge Instructions (Signed)
Take the prescription meds as directed. Use the Neo-synephrine nasal spray for the next 2-5 days for symptom relief as discussed.  Consider dosing Benadryl at bedtime for additional sleep and allergy relief.

## 2016-05-28 NOTE — ED Notes (Signed)
States he has had a lot of sinus pressure and congestion/drainage for several days   Afebrile on arrival

## 2016-05-28 NOTE — ED Provider Notes (Signed)
[facility] Emergency Department Provider Note ____________________________________________  Time seen: 1305  I have reviewed the triage vital signs and the nursing notes.  HISTORY  Chief Complaint  Nasal Congestion and Generalized Body Aches  HPI Brent Stone is a 25 y.o. male presents to the ED for evaluation of chest congestion and sinus pressure for the last couple weeks. He describes a significant worsening of his symptoms over the last 2-3 days. He also reports associated generalized bodyaches with his symptoms. He denies any outright fevers, chills, sweats. He describes difficulty breathing through his nose as well as spontaneous nasal drainage at times. He denies any chest pain, shortness of breath, or headaches.  History reviewed. No pertinent past medical history.  There are no active problems to display for this patient.  History reviewed. No pertinent surgical history.  Prior to Admission medications   Medication Sig Start Date End Date Taking? Authorizing Provider  cetirizine-pseudoephedrine (ZYRTEC-D) 5-120 MG tablet Take 1 tablet by mouth 2 (two) times daily. 05/28/16   Seidy Labreck V Bacon Derreck Wiltsey, PA-C  fluticasone (FLONASE) 50 MCG/ACT nasal spray Place 2 sprays into both nostrils daily. 05/28/16   Josetta Wigal V Bacon Lilyanah Celestin, PA-C    Allergies Review of patient's allergies indicates no known allergies.  No family history on file.  Social History Social History  Substance Use Topics  . Smoking status: Current Every Day Smoker    Packs/day: 1.00    Types: Cigarettes  . Smokeless tobacco: Never Used  . Alcohol use Yes   Review of Systems  Constitutional: Negative for fever. Eyes: Negative for visual changes. ENT: Negative for sore throat.Reports nasal congestion and sinus drainage as above. Cardiovascular: Negative for chest pain. Respiratory: Negative for shortness of breath. Reports intermittent nonproductive cough. Gastrointestinal:  Negative for abdominal pain, vomiting and diarrhea. Skin: Negative for rash. Neurological: Negative for headaches, focal weakness or numbness. ____________________________________________  PHYSICAL EXAM:  VITAL SIGNS: ED Triage Vitals  Enc Vitals Group     BP 05/28/16 1139 131/89     Pulse Rate 05/28/16 1139 92     Resp 05/28/16 1139 18     Temp 05/28/16 1139 97.6 F (36.4 C)     Temp Source 05/28/16 1139 Oral     SpO2 05/28/16 1139 97 %     Weight 05/28/16 1139 170 lb (77.1 kg)     Height 05/28/16 1139 5\' 6"  (1.676 m)     Head Circumference --      Peak Flow --      Pain Score 05/28/16 1142 2     Pain Loc --      Pain Edu? --      Excl. in GC? --    Constitutional: Alert and oriented. Well appearing and in no distress. Head: Normocephalic and atraumatic. Eyes: Conjunctivae are normal. PERRL. Normal extraocular movements Ears: Canals clear. TMs intact bilaterally. Nose: Bilateral nares occluded by edematous, enlarged, pink turbinates. membranes. Copious clear rhinorrhea bilaterally. No epistaxis. Mouth/Throat: Mucous membranes are moist. Neck: Supple. No thyromegaly. Hematological/Lymphatic/Immunological: No cervical lymphadenopathy. Cardiovascular: Normal rate, regular rhythm. Normal distal pulses. Respiratory: Normal respiratory effort. No wheezes/rales/rhonchi. Skin:  Skin is warm, dry and intact. No rash noted. ____________________________________________  PROCEDURES  Decadron 10 mg IM Neo-synephrine ii sprays to each nostril ____________________________________________  INITIAL IMPRESSION / ASSESSMENT AND PLAN / ED COURSE  Patient with an acute allergic rhinitis and sinusitis. Symptoms appear to be allergic in nature. He will be discharged with a prescriptions for cetirizine-pseudoephedrine and Flonase  to dose as directed. He will follow up with one of the local community clinics for ongoing symptom management.  Clinical Course    ____________________________________________  FINAL CLINICAL IMPRESSION(S) / ED DIAGNOSES  Final diagnoses:  Acute allergic rhinitis due to pollen, unspecified seasonality  Acute maxillary sinusitis, recurrence not specified      Lissa HoardJenise V Bacon Aundreya Souffrant, PA-C 05/28/16 1801    Emily FilbertJonathan E Williams, MD 05/29/16 0700

## 2016-05-28 NOTE — ED Notes (Addendum)
Pt administered nasal spray. Pt is nasal passage is very closed off. Pt was not able to sniff very much of the medication back. Airflow in passage way is limited.

## 2016-09-21 ENCOUNTER — Emergency Department
Admission: EM | Admit: 2016-09-21 | Discharge: 2016-09-21 | Disposition: A | Discharge status: Home / Self Care | Payer: Self-pay | Attending: Emergency Medicine | Admitting: Emergency Medicine

## 2016-09-21 ENCOUNTER — Encounter: Payer: Self-pay | Admitting: Emergency Medicine

## 2016-09-21 ENCOUNTER — Emergency Department: Payer: Self-pay

## 2016-09-21 DIAGNOSIS — F17200 Nicotine dependence, unspecified, uncomplicated: Secondary | ICD-10-CM

## 2016-09-21 DIAGNOSIS — R0981 Nasal congestion: Secondary | ICD-10-CM | POA: Insufficient documentation

## 2016-09-21 DIAGNOSIS — R059 Cough, unspecified: Secondary | ICD-10-CM

## 2016-09-21 DIAGNOSIS — R05 Cough: Secondary | ICD-10-CM | POA: Insufficient documentation

## 2016-09-21 DIAGNOSIS — Z79899 Other long term (current) drug therapy: Secondary | ICD-10-CM | POA: Insufficient documentation

## 2016-09-21 DIAGNOSIS — F17210 Nicotine dependence, cigarettes, uncomplicated: Secondary | ICD-10-CM | POA: Insufficient documentation

## 2016-09-21 MED ORDER — LORATADINE 10 MG PO TABS: 10.0000 mg | ORAL_TABLET | Freq: Every day | ORAL | 0 refills | Status: DC

## 2016-09-21 NOTE — ED Provider Notes (Signed)
[facility] Emergency Department Provider Note  ____________________________________________  Time seen: Approximately 4:25 PM  I have reviewed the triage vital signs and the nursing notes.   HISTORY  Chief Complaint Cough    HPI Brent Stone is a 26 y.o. male , NAD, presents to the emergency department for evaluation of cough. Patient states he has had an intermittent cough for approximately 6 months. Over the last month states he has had significantly increased mucous production as well as nasal mucus production. States that at times mucus from the nasal passages as well as when he produces with cough can be tinged with blood. Endorses a 7 year smoking history in which she has been smoking approximately one pack per day over the last 3-4 years. Denies any chest pain, shortness of breath, wheezing, palpitations. Satting fevers, chills or body aches. Does have a history of seasonal allergies in which she states were worse when he was younger and believes that they have improved as has gotten older. Denies headache, visual changes. Has not noted any skin sores. Denies night sweats, unwanted weight loss, changes in appetite. Has had no abdominal pain, nausea, vomiting.   No past medical history on file.  There are no active problems to display for this patient.   History reviewed. No pertinent surgical history.  Prior to Admission medications   Medication Sig Start Date End Date Taking? Authorizing Provider  cetirizine-pseudoephedrine (ZYRTEC-D) 5-120 MG tablet Take 1 tablet by mouth 2 (two) times daily. 05/28/16   Jenise V Bacon Menshew, PA-C  fluticasone (FLONASE) 50 MCG/ACT nasal spray Place 2 sprays into both nostrils daily. 05/28/16   Jenise V Bacon Menshew, PA-C  loratadine (CLARITIN) 10 MG tablet Take 1 tablet (10 mg total) by mouth daily. 09/21/16   Antoinette Haskett L Oather Muilenburg, PA-C    Allergies Patient has no known allergies.  No family history on file.  Social  History Social History  Substance Use Topics  . Smoking status: Current Every Day Smoker    Packs/day: 1.00    Types: Cigarettes  . Smokeless tobacco: Never Used  . Alcohol use Yes     Review of Systems  Constitutional: No fever/chills, Fatigue, night sweats, unwanted weight loss Eyes: No visual changes.  ENT: Positive nasal congestion. No sore throat. Cardiovascular: No chest pain, palpitations. Respiratory: Positive productive cough. No shortness of breath. No wheezing.  Gastrointestinal: No abdominal pain.  No nausea, vomiting.  No diarrhea.   Musculoskeletal: Negative for back, neck pain. No general myalgias. Skin: Negative for rash, skin sores. Neurological: Negative for headaches, focal weakness or numbness. No tingling. 10-point ROS otherwise negative.  ____________________________________________   PHYSICAL EXAM:  VITAL SIGNS: ED Triage Vitals  Enc Vitals Group     BP 09/21/16 1551 120/83     Pulse Rate 09/21/16 1551 91     Resp 09/21/16 1551 18     Temp 09/21/16 1551 98.2 F (36.8 C)     Temp Source 09/21/16 1551 Oral     SpO2 09/21/16 1551 96 %     Weight 09/21/16 1552 170 lb (77.1 kg)     Height 09/21/16 1552 5\' 6"  (1.676 m)     Head Circumference --      Peak Flow --      Pain Score --      Pain Loc --      Pain Edu? --      Excl. in GC? --      Constitutional: Alert and oriented.  Well appearing and in no acute distress. Eyes: Conjunctivae are normal Without icterus, injection or discharge. Head: Atraumatic. ENT:      Ears: TMs visualized bilaterally with mild serous effusion but no bulging, erythema or perforation.      Nose: No congestion/rhinnorhea. Bilateral turbinates are injected. No epistaxis.      Mouth/Throat: Mucous membranes are moist. Pharynx without erythema, swelling, exudate. Uvula is midline. Airway is patent. No postnasal drainage. Neck: No stridor. No carotid bruits. Supple with full range of  motion. Hematological/Lymphatic/Immunilogical: No cervical, supraclavicular lymphadenopathy. Cardiovascular: Normal rate, regular rhythm. Normal S1 and S2.  Good peripheral circulation. Respiratory: Normal respiratory effort without tachypnea or retractions. Lungs CTAB with breath sounds noted in all lung fields. No wheeze, rhonchi, rales. Musculoskeletal: Full range of motion of bilateral upper portion is without pain or difficulty. Neurologic:  Normal speech and language. No gross focal neurologic deficits are appreciated.  Skin:  Skin is warm, dry and intact. No rash, skin sores noted. Psychiatric: Mood and affect are normal. Speech and behavior are normal. Patient exhibits appropriate insight and judgement.   ____________________________________________   LABS  None ____________________________________________  EKG  None ____________________________________________  RADIOLOGY I, Hope PigeonJami L Eveleen Mcnear, personally viewed and evaluated these images (plain radiographs) as part of my medical decision making, as well as reviewing the written report by the radiologist.  Dg Chest 2 View  Result Date: 09/21/2016 CLINICAL DATA:  Cough for 6 months.  Hemoptysis for 1 week.  Smoker. EXAM: CHEST  2 VIEW COMPARISON:  Chest radiograph October 15, 2011 FINDINGS: Cardiomediastinal silhouette is normal. No pleural effusions or focal consolidations. Trachea projects midline and there is no pneumothorax. Soft tissue planes and included osseous structures are non-suspicious. IMPRESSION: Normal chest. Electronically Signed   By: Awilda Metroourtnay  Bloomer M.D.   On: 09/21/2016 16:54    ____________________________________________    PROCEDURES  Procedure(s) performed: None   Procedures   Medications - No data to display   ____________________________________________   INITIAL IMPRESSION / ASSESSMENT AND PLAN / ED COURSE  Pertinent labs & imaging results that were available during my care of the patient  were reviewed by me and considered in my medical decision making (see chart for details).     Patient's diagnosis is consistent with cough and tobacco use disorder. Patient will be discharged home with prescriptions for loratadine to take as directed. Patient is encouraged to cessate tobacco use. [facility] if symptoms persist past this treatment course. Patient is given ED precautions to return to the ED for any worsening or new symptoms.   ____________________________________________  FINAL CLINICAL IMPRESSION(S) / ED DIAGNOSES  Final diagnoses:  Cough  Tobacco use disorder      NEW MEDICATIONS STARTED DURING THIS VISIT:  Discharge Medication List as of 09/21/2016  5:03 PM    START taking these medications   Details  loratadine (CLARITIN) 10 MG tablet Take 1 tablet (10 mg total) by mouth daily., Starting Thu 09/21/2016, Print             Hope PigeonJami L Toryn Mcclinton, PA-C 09/21/16 1710    Sharman CheekPhillip Stafford, MD 09/21/16 [phone]

## 2016-09-21 NOTE — ED Triage Notes (Signed)
Pt presents ambulatory to stat desk with c/o cough for a while. Pt with no acute distress noted.
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
## 2020-12-31 ENCOUNTER — Other Ambulatory Visit: Payer: Self-pay

## 2020-12-31 ENCOUNTER — Inpatient Hospital Stay
Admission: EM | Admit: 2020-12-31 | Discharge: 2021-01-02 | DRG: 918 | Disposition: A | Discharge status: Other Acute Inpatient Hospital | Payer: Self-pay | Attending: Internal Medicine | Admitting: Internal Medicine

## 2020-12-31 DIAGNOSIS — R45851 Suicidal ideations: Secondary | ICD-10-CM

## 2020-12-31 DIAGNOSIS — F332 Major depressive disorder, recurrent severe without psychotic features: Secondary | ICD-10-CM | POA: Diagnosis present

## 2020-12-31 DIAGNOSIS — Z7289 Other problems related to lifestyle: Secondary | ICD-10-CM

## 2020-12-31 DIAGNOSIS — F1010 Alcohol abuse, uncomplicated: Secondary | ICD-10-CM | POA: Diagnosis present

## 2020-12-31 DIAGNOSIS — Z720 Tobacco use: Secondary | ICD-10-CM | POA: Diagnosis present

## 2020-12-31 DIAGNOSIS — F17210 Nicotine dependence, cigarettes, uncomplicated: Secondary | ICD-10-CM | POA: Diagnosis present

## 2020-12-31 DIAGNOSIS — T391X1A Poisoning by 4-Aminophenol derivatives, accidental (unintentional), initial encounter: Secondary | ICD-10-CM | POA: Diagnosis present

## 2020-12-31 DIAGNOSIS — Z20822 Contact with and (suspected) exposure to covid-19: Secondary | ICD-10-CM | POA: Diagnosis present

## 2020-12-31 DIAGNOSIS — E876 Hypokalemia: Secondary | ICD-10-CM | POA: Diagnosis present

## 2020-12-31 DIAGNOSIS — Z789 Other specified health status: Secondary | ICD-10-CM

## 2020-12-31 DIAGNOSIS — Y903 Blood alcohol level of 60-79 mg/100 ml: Secondary | ICD-10-CM | POA: Diagnosis present

## 2020-12-31 DIAGNOSIS — T391X2A Poisoning by 4-Aminophenol derivatives, intentional self-harm, initial encounter: Principal | ICD-10-CM | POA: Diagnosis present

## 2020-12-31 HISTORY — DX: Other specified health status: Z78.9

## 2020-12-31 HISTORY — DX: Tobacco use: Z72.0

## 2020-12-31 HISTORY — DX: Other problems related to lifestyle: Z72.89

## 2020-12-31 LAB — COMPREHENSIVE METABOLIC PANEL
ALT: 37 U/L (ref 0–44)
AST: 34 U/L (ref 15–41)
Albumin: 4 g/dL (ref 3.5–5.0)
Alkaline Phosphatase: 65 U/L (ref 38–126)
Anion gap: 9 (ref 5–15)
BUN: 10 mg/dL (ref 6–20)
CO2: 26 mmol/L (ref 22–32)
Calcium: 9.1 mg/dL (ref 8.9–10.3)
Chloride: 105 mmol/L (ref 98–111)
Creatinine, Ser: 0.83 mg/dL (ref 0.61–1.24)
GFR, Estimated: >60 mL/min (ref >60)
Glucose, Bld: 99 mg/dL (ref 70–99)
Potassium: 3.3 mmol/L — ABNORMAL LOW (ref 3.5–5.1)
Sodium: 140 mmol/L (ref 135–145)
Total Bilirubin: 0.7 mg/dL (ref 0.3–1.2)
Total Protein: 7.3 g/dL (ref 6.5–8.1)

## 2020-12-31 LAB — ACETAMINOPHEN LEVEL: Acetaminophen (Tylenol), Serum: 26 ug/mL (ref 10–30)

## 2020-12-31 LAB — CBC
HCT: 41.6 % (ref 39.0–52.0)
Hemoglobin: 14.7 g/dL (ref 13.0–17.0)
MCH: 30.8 pg (ref 26.0–34.0)
MCHC: 35.3 g/dL (ref 30.0–36.0)
MCV: 87 fL (ref 80.0–100.0)
Platelets: 399 10*3/uL (ref 150–400)
RBC: 4.78 MIL/uL (ref 4.22–5.81)
RDW: 12.2 % (ref 11.5–15.5)
WBC: 8.9 10*3/uL (ref 4.0–10.5)
nRBC: 0 % (ref 0.0–0.2)

## 2020-12-31 LAB — RESP PANEL BY RT-PCR (FLU A&B, COVID) ARPGX2
Influenza A by PCR: NEGATIVE
Influenza B by PCR: NEGATIVE
SARS Coronavirus 2 by RT PCR: NEGATIVE

## 2020-12-31 LAB — SALICYLATE LEVEL: Salicylate Lvl: <7 mg/dL — ABNORMAL LOW (ref 7.0–30.0)

## 2020-12-31 LAB — ETHANOL: Alcohol, Ethyl (B): 75 mg/dL — ABNORMAL HIGH (ref <10)

## 2020-12-31 LAB — CBG MONITORING, ED: Glucose-Capillary: 88 mg/dL (ref 70–99)

## 2020-12-31 MED ORDER — CHARCOAL ACTIVATED PO LIQD: 50.0000 g | Freq: Once | ORAL | Status: DC

## 2020-12-31 MED ORDER — ONDANSETRON HCL 4 MG/2ML IJ SOLN
4.0000 mg | Freq: Once | INTRAMUSCULAR | Status: AC
  Administered 2020-12-31: 4 mg via INTRAVENOUS
  Filled 2020-12-31: qty 2

## 2020-12-31 MED ORDER — CHARCOAL ACTIVATED PO LIQD
75.0000 g | Freq: Once | ORAL | Status: AC
  Administered 2020-12-31: 75 g via ORAL
  Filled 2020-12-31: qty 480

## 2020-12-31 NOTE — ED Notes (Signed)
Pt changed into hospital appropriate scrubs by this RN and tech.  Clothes placed into 1 belongings bag.  Contents include: 1 red t shirt, 1 pair jeans, 1 pair brown boots, 1 pair boxers, 1 pair black socks, 1 cell phone, cigarettes, lighter, work badge, cards.

## 2020-12-31 NOTE — ED Notes (Signed)
Poison control notified of ingestion. Spoke with Raynelle Fanning, recommendations as follows: activated charcoal (1gram/kg w/out sorbitol) if pt is willing and no N/V or stomach upset, 4 hour post ingestion acetaminophen level and CMP, and cardiac monitoring.

## 2020-12-31 NOTE — ED Triage Notes (Signed)
Per EMS/pt report, pt purposely took 24-500 mg tablet of tylenol 45 mins ago. Pt states "it's a long story," reports it was due to "something going on for awhile." Pt states he's never tried to kill himself, that this was his first attempt. Pt is AOX4, NAD noted. Pt denies recent ETOH, denies drug use.

## 2020-12-31 NOTE — ED Provider Notes (Signed)
[facility] Emergency Department Provider Note  ____________________________________________   Event Date/Time   First MD Initiated Contact with Patient 12/31/20 2208     (approximate)  I have reviewed the triage vital signs and the nursing notes.   HISTORY  Chief Complaint Tylenol overdose  HPI Brent Stone is a 30 y.o. male otherwise healthy comes in for overdose.  Patient states that he had SI and attempted to take 24 500 mg Tylenol.  He stated afterwards he instantly regretted it and he called his sister who told him to come to the emergency room.  Patient denies any symptoms right now.  Patient does not want to go into details about why he is having SI.  He reports drinking a little bit around 5:30 PM.  Denies any other drug use.  SI, severe, constant, states that he regrets the attempt after taking the medications.  Unclear what brought it on given patient does not want to share.          History reviewed. No pertinent past medical history.  There are no problems to display for this patient.   No past surgical history on file.  Prior to Admission medications   Medication Sig Start Date End Date Taking? Authorizing Provider  cetirizine-pseudoephedrine (ZYRTEC-D) 5-120 MG tablet Take 1 tablet by mouth 2 (two) times daily. 05/28/16   Menshew, Charlesetta Ivory, PA-C  fluticasone (FLONASE) 50 MCG/ACT nasal spray Place 2 sprays into both nostrils daily. 05/28/16   Menshew, Charlesetta Ivory, PA-C  loratadine (CLARITIN) 10 MG tablet Take 1 tablet (10 mg total) by mouth daily. 09/21/16   Hagler, Jami L, PA-C    Allergies Patient has no known allergies.  No family history on file.  Social History Social History   Tobacco Use  . Smoking status: Current Every Day Smoker    Packs/day: 1.00    Types: Cigarettes  . Smokeless tobacco: Never Used  Substance Use Topics  . Alcohol use: Yes  . Drug use: No      Review of Systems Constitutional: No  fever/chills Eyes: No visual changes. ENT: No sore throat. Cardiovascular: Denies chest pain. Respiratory: Denies shortness of breath. Gastrointestinal: No abdominal pain.  No nausea, no vomiting.  No diarrhea.  No constipation. Genitourinary: Negative for dysuria. Musculoskeletal: Negative for back pain. Skin: Negative for rash. Neurological: Negative for headaches, focal weakness or numbness. Psych: SI, Tylenol overdose All other ROS negative ____________________________________________   PHYSICAL EXAM:  VITAL SIGNS: ED Triage Vitals [12/31/20 2208]  Enc Vitals Group     BP      Pulse Rate 93     Resp 18     Temp 98.7 F (37.1 C)     Temp Source Oral     SpO2 98 %     Weight      Height      Head Circumference      Peak Flow      Pain Score      Pain Loc      Pain Edu?      Excl. in GC?     Constitutional: Alert and oriented. Well appearing and in no acute distress. Eyes: Conjunctivae are normal. EOMI. Head: Atraumatic. Nose: No congestion/rhinnorhea. Mouth/Throat: Mucous membranes are moist.   Neck: No stridor. Trachea Midline. FROM Cardiovascular: Normal rate, regular rhythm. Grossly normal heart sounds.  Good peripheral circulation. Respiratory: Normal respiratory effort.  No retractions. Lungs CTAB. Gastrointestinal: Soft and nontender. No distention. No  abdominal bruits.  Musculoskeletal: No lower extremity tenderness nor edema.  No joint effusions. Neurologic:  Normal speech and language. No gross focal neurologic deficits are appreciated.  Skin:  Skin is warm, dry and intact. No rash noted. Psychiatric: Positive SI with attempt of overdose GU: Deferred   ____________________________________________   LABS (all labs ordered are listed, but only abnormal results are displayed)  Labs Reviewed  COMPREHENSIVE METABOLIC PANEL - Abnormal; Notable for the following components:      Result Value   Potassium 3.3 (*)    All other components within normal  limits  ETHANOL - Abnormal; Notable for the following components:   Alcohol, Ethyl (B) 75 (*)    All other components within normal limits  SALICYLATE LEVEL - Abnormal; Notable for the following components:   Salicylate Lvl <7.0 (*)    All other components within normal limits  RESP PANEL BY RT-PCR (FLU A&B, COVID) ARPGX2  ACETAMINOPHEN LEVEL  CBC  URINE DRUG SCREEN, QUALITATIVE ([facility] ONLY)  ACETAMINOPHEN LEVEL  CBG MONITORING, ED   ____________________________________________   ED ECG REPORT I, Concha Se, the attending physician, personally viewed and interpreted this ECG.  Normal sinus rate of 95, no ST elevation, no T wave versions, normal intervals ____________________________________________    PROCEDURES  Procedure(s) performed (including Critical Care):  .1-3 Lead EKG Interpretation Performed by: Concha Se, MD Authorized by: Concha Se, MD     Interpretation: normal     ECG rate:  90s   ECG rate assessment: normal     Rhythm: sinus rhythm     Ectopy: none     Conduction: normal       ____________________________________________   INITIAL IMPRESSION / ASSESSMENT AND PLAN / ED COURSE  DERAK SCHURMAN was evaluated in Emergency Department on 12/31/2020 for the symptoms described in the history of present illness. He was evaluated in the context of the global COVID-19 pandemic, which necessitated consideration that the patient might be at risk for infection with the SARS-CoV-2 virus that causes COVID-19. Institutional protocols and algorithms that pertain to the evaluation of patients at risk for COVID-19 are in a state of rapid change based on information released by regulatory bodies including the CDC and federal and state organizations. These policies and algorithms were followed during the patient's care in the ED.    Patient comes in with Tylenol overdose.  Will discuss with poison control.  We will keep on the cardiac monitor and get EKG.  We will  check for other overdose potential such as salicylate, drugs, EtOH.  We will check electrolytes.  Will IVC patient due to concern for SI  Poison control recommended activated charcoal.  Initial Tylenol level was 26.  Given time of ingestion patient will need repeat level at 1:30 AM given patient reports taking the medications at 9:30 PM an hour prior to arrival.  Patient handed off to oncoming team pending Tylenol, psych       ____________________________________________   FINAL CLINICAL IMPRESSION(S) / ED DIAGNOSES   Final diagnoses:  Intentional acetaminophen overdose, initial encounter [facility])  Suicide ideation      MEDICATIONS GIVEN DURING THIS VISIT:  Medications  charcoal activated (NO SORBITOL) (ACTIDOSE-AQUA) suspension 75 g (75 g Oral Given 12/31/20 2308)  ondansetron (ZOFRAN) injection 4 mg (4 mg Intravenous Given 12/31/20 2308)     ED Discharge Orders    None       Note:  This document was prepared using Dragon voice recognition software  and may include unintentional dictation errors.   Concha Se, MD 12/31/20 [phone]

## 2020-12-31 NOTE — ED Notes (Signed)
Pt denies nausea and stomach upset, agreeable to the activated charcoal.

## 2021-01-01 ENCOUNTER — Encounter: Payer: Self-pay | Admitting: Internal Medicine

## 2021-01-01 DIAGNOSIS — Z720 Tobacco use: Secondary | ICD-10-CM

## 2021-01-01 DIAGNOSIS — E876 Hypokalemia: Secondary | ICD-10-CM | POA: Diagnosis present

## 2021-01-01 DIAGNOSIS — Z789 Other specified health status: Secondary | ICD-10-CM

## 2021-01-01 DIAGNOSIS — T391X2A Poisoning by 4-Aminophenol derivatives, intentional self-harm, initial encounter: Secondary | ICD-10-CM | POA: Diagnosis present

## 2021-01-01 DIAGNOSIS — T391X1A Poisoning by 4-Aminophenol derivatives, accidental (unintentional), initial encounter: Secondary | ICD-10-CM | POA: Diagnosis present

## 2021-01-01 DIAGNOSIS — F332 Major depressive disorder, recurrent severe without psychotic features: Secondary | ICD-10-CM | POA: Diagnosis present

## 2021-01-01 DIAGNOSIS — Z7289 Other problems related to lifestyle: Secondary | ICD-10-CM

## 2021-01-01 DIAGNOSIS — R45851 Suicidal ideations: Secondary | ICD-10-CM

## 2021-01-01 LAB — URINE DRUG SCREEN, QUALITATIVE (ARMC ONLY)
Amphetamines, Ur Screen: NOT DETECTED
Barbiturates, Ur Screen: NOT DETECTED
Benzodiazepine, Ur Scrn: NOT DETECTED
Cannabinoid 50 Ng, Ur ~~LOC~~: NOT DETECTED
Cocaine Metabolite,Ur ~~LOC~~: NOT DETECTED
MDMA (Ecstasy)Ur Screen: NOT DETECTED
Methadone Scn, Ur: NOT DETECTED
Opiate, Ur Screen: NOT DETECTED
Phencyclidine (PCP) Ur S: NOT DETECTED
Tricyclic, Ur Screen: NOT DETECTED

## 2021-01-01 LAB — MAGNESIUM: Magnesium: 1.9 mg/dL (ref 1.7–2.4)

## 2021-01-01 LAB — GLUCOSE, CAPILLARY: Glucose-Capillary: 102 mg/dL — ABNORMAL HIGH (ref 70–99)

## 2021-01-01 LAB — PROTIME-INR
INR: 1.2 (ref 0.8–1.2)
Prothrombin Time: 15.4 seconds — ABNORMAL HIGH (ref 11.4–15.2)

## 2021-01-01 LAB — PHOSPHORUS: Phosphorus: 2.8 mg/dL (ref 2.5–4.6)

## 2021-01-01 LAB — ACETAMINOPHEN LEVEL
Acetaminophen (Tylenol), Serum: 131 ug/mL — ABNORMAL HIGH (ref 10–30)
Acetaminophen (Tylenol), Serum: 15 ug/mL (ref 10–30)

## 2021-01-01 MED ORDER — LORAZEPAM 2 MG/ML IJ SOLN: 1.0000 mg | INTRAMUSCULAR | Status: DC | PRN

## 2021-01-01 MED ORDER — THIAMINE HCL 100 MG/ML IJ SOLN: 100.0000 mg | Freq: Every day | INTRAMUSCULAR | Status: DC

## 2021-01-01 MED ORDER — THIAMINE HCL 100 MG PO TABS
100.0000 mg | ORAL_TABLET | Freq: Every day | ORAL | Status: DC
  Administered 2021-01-02: 100 mg via ORAL
  Filled 2021-01-01: qty 1

## 2021-01-01 MED ORDER — LORAZEPAM 1 MG PO TABS: 1.0000 mg | ORAL_TABLET | ORAL | Status: DC | PRN

## 2021-01-01 MED ORDER — ONDANSETRON HCL 4 MG/2ML IJ SOLN: 4.0000 mg | Freq: Three times a day (TID) | INTRAMUSCULAR | Status: DC | PRN

## 2021-01-01 MED ORDER — POTASSIUM CHLORIDE CRYS ER 20 MEQ PO TBCR
40.0000 meq | EXTENDED_RELEASE_TABLET | Freq: Once | ORAL | Status: AC
  Administered 2021-01-01: 09:00:00 40 meq via ORAL
  Filled 2021-01-01: qty 2

## 2021-01-01 MED ORDER — ADULT MULTIVITAMIN W/MINERALS CH
1.0000 | ORAL_TABLET | Freq: Every day | ORAL | Status: DC
  Administered 2021-01-01 – 2021-01-02 (×2): 1 via ORAL
  Filled 2021-01-01 (×2): qty 1

## 2021-01-01 MED ORDER — THIAMINE HCL 100 MG PO TABS: 100.0000 mg | ORAL_TABLET | Freq: Every day | ORAL | Status: DC

## 2021-01-01 MED ORDER — IBUPROFEN 400 MG PO TABS: 200.0000 mg | ORAL_TABLET | Freq: Four times a day (QID) | ORAL | Status: DC | PRN

## 2021-01-01 MED ORDER — FOLIC ACID 1 MG PO TABS
1.0000 mg | ORAL_TABLET | Freq: Every day | ORAL | Status: DC
  Administered 2021-01-01: 14:00:00 1 mg via ORAL

## 2021-01-01 MED ORDER — LORAZEPAM 2 MG/ML IJ SOLN: 0.0000 mg | Freq: Two times a day (BID) | INTRAMUSCULAR | Status: DC

## 2021-01-01 MED ORDER — THIAMINE HCL 100 MG PO TABS
100.0000 mg | ORAL_TABLET | Freq: Every day | ORAL | Status: DC
  Administered 2021-01-01: 09:00:00 100 mg via ORAL
  Filled 2021-01-01: qty 1

## 2021-01-01 MED ORDER — ACETYLCYSTEINE LOAD VIA INFUSION
150.0000 mg/kg | Freq: Once | INTRAVENOUS | Status: AC
  Administered 2021-01-01: 11565 mg via INTRAVENOUS
  Filled 2021-01-01: qty 290

## 2021-01-01 MED ORDER — NICOTINE 21 MG/24HR TD PT24
21.0000 mg | MEDICATED_PATCH | Freq: Every day | TRANSDERMAL | Status: DC
  Administered 2021-01-01 – 2021-01-02 (×2): 21 mg via TRANSDERMAL
  Filled 2021-01-01 (×2): qty 1

## 2021-01-01 MED ORDER — PNEUMOCOCCAL VAC POLYVALENT 25 MCG/0.5ML IJ INJ: 0.5000 mL | INJECTION | INTRAMUSCULAR | Status: DC

## 2021-01-01 MED ORDER — ENOXAPARIN SODIUM 40 MG/0.4ML IJ SOSY
40.0000 mg | PREFILLED_SYRINGE | INTRAMUSCULAR | Status: DC
  Administered 2021-01-01: 20:00:00 40 mg via SUBCUTANEOUS
  Filled 2021-01-01: qty 0.4

## 2021-01-01 MED ORDER — LORAZEPAM 2 MG/ML IJ SOLN: 0.0000 mg | Freq: Four times a day (QID) | INTRAMUSCULAR | Status: DC

## 2021-01-01 MED ORDER — FOLIC ACID 1 MG PO TABS
1.0000 mg | ORAL_TABLET | Freq: Every day | ORAL | Status: DC
  Administered 2021-01-01 – 2021-01-02 (×2): 1 mg via ORAL
  Filled 2021-01-01 (×3): qty 1

## 2021-01-01 MED ORDER — DEXTROSE 5 % IV SOLN
15.0000 mg/kg/h | INTRAVENOUS | Status: DC
  Administered 2021-01-01 (×2): 15 mg/kg/h via INTRAVENOUS
  Filled 2021-01-01 (×4): qty 120

## 2021-01-01 NOTE — BH Assessment (Signed)
Comprehensive Clinical Assessment (CCA) Note  01/01/2021 Brent Stone [id number]   Brent Stone, 30 year old male who presents to [facility] ED involuntarily for treatment. Per triage note, Per EMS/pt report, pt purposely took 24-500 mg tablet of tylenol 45 mins ago. Pt states "it's a long story," reports it was due to "something going on for awhile." Pt states he's never tried to kill himself, that this was his first attempt. Pt is AOX4, NAD noted. Pt denies recent ETOH, denies drug use.   During TTS assessment pt presents alert and oriented x 4, anxious but cooperative, and mood-congruent with affect. The pt does not appear to be responding to internal or external stimuli. Neither is the pt presenting with any delusional thinking. Pt verified the information provided to triage RN.   Pt identifies his main complaint to be that he took an entire bottle of pills because he was sad and depressed. Patient reports he has been having these feelings since being in high school. Patient states after taking the pills, he regretted doing so. Patient states he is not able to pin point a specific reason for this attempt other than "a build up of small things." Patient reports "it's hard to explain" and did not go into further detail. Patient reports he lives with his younger sister and recently started working at [employer] about 1 week ago. Patient reports he lived in [place] where he stayed in hotels for 4 years. Patient reports at that time, he was only sleeping for 2-3 hours per night but now that he has a nice comfortable bed, his sleep habits are much better. Pt denies any illicit drug use and alcohol. Pt reports no current INPT hx or OPT hx but believes therapy and medication would be helpful. Pt denies current SI/HI/AH/VH.    Pending Provider disposition.    Chief Complaint:  Chief Complaint  Patient presents with  . Suicidal  . Depression    Patient reports he took entire bottle (24 count) of Tylenol.     Visit Diagnosis: Major Depressive Disorder   CCA Screening, Triage and Referral (STR)  Patient Reported Information How did you hear about [place]? Self  Referral name: No data recorded Referral phone number: No data recorded  Whom do you see for routine medical problems? I don't have a doctor  Practice/Facility Name: No data recorded Practice/Facility Phone Number: No data recorded Name of Contact: No data recorded Contact Number: No data recorded Contact Fax Number: No data recorded Prescriber Name: No data recorded Prescriber Address (if known): No data recorded  What Is the Reason for Your Visit/Call Today? Patient states he took 24 pills (Tylenol) because he was feeling sad; yet he reports he regrets doing it.  How Long Has This Been Causing You Problems? > than 6 months  What Do You Feel Would Help You the Most Today? Treatment for Depression or other mood problem; Stress Management   Have You Recently Been in Any Inpatient Treatment (Hospital/Detox/Crisis Center/28-Day Program)? No  Name/Location of Program/Hospital:No data recorded How Long Were You There? No data recorded When Were You Discharged? No data recorded  Have You Ever Received Services From [facility] Before? No  Who Do You See at [facility]? No data recorded  Have You Recently Had Any Thoughts About Hurting Yourself? Yes  Are You Planning to Commit Suicide/Harm Yourself At This time? No   Have you Recently Had Thoughts About Hurting Someone Karolee Ohs? No  Explanation: No data recorded  Have  You Used Any Alcohol or Drugs in the Past 24 Hours? Yes  How Long Ago Did You Use Drugs or Alcohol? No data recorded What Did You Use and How Much? Patient reports he drank 1 can of Four Loco.   Do You Currently Have a Therapist/Psychiatrist? No  Name of Therapist/Psychiatrist: No data recorded  Have You Been Recently Discharged From Any Office Practice or Programs? No  Explanation of Discharge From  Practice/Program: No data recorded    CCA Screening Triage Referral Assessment Type of Contact: Face-to-Face  Is this Initial or Reassessment? No data recorded Date Telepsych consult ordered in [facility]:  No data recorded Time Telepsych consult ordered in [facility]:  No data recorded  Patient Reported Information Reviewed? Yes  Patient Left Without Being Seen? No data recorded Reason for Not Completing Assessment: No data recorded  Collateral Involvement: None provided   Does Patient Have a Court Appointed Legal Guardian? No data recorded Name and Contact of Legal Guardian: No data recorded If Minor and Not Living with Parent(s), Who has Custody? n/a  Is CPS involved or ever been involved? Never  Is APS involved or ever been involved? Never   Patient Determined To Be At Risk for Harm To Self or Others Based on Review of Patient Reported Information or Presenting Complaint? No  Method: No data recorded Availability of Means: No data recorded Intent: No data recorded Notification Required: No data recorded Additional Information for Danger to Others Potential: No data recorded Additional Comments for Danger to Others Potential: No data recorded Are There Guns or Other Weapons in Your Home? No data recorded Types of Guns/Weapons: No data recorded Are These Weapons Safely Secured?                            No data recorded Who Could Verify You Are Able To Have These Secured: No data recorded Do You Have any Outstanding Charges, Pending Court Dates, Parole/Probation? No data recorded Contacted To Inform of Risk of Harm To Self or Others: No data recorded  Location of Assessment: [facility] ED   Does Patient Present under Involuntary Commitment? Yes  IVC Papers Initial File Date: 01/01/2021   [place] of Residence: [place]   Patient Currently Receiving the Following Services: Not Receiving Services   Determination of Need: Urgent (48 hours)   Options For Referral: ED Visit;  Outpatient Therapy; Medication Management     CCA Biopsychosocial Intake/Chief Complaint:  Patient reports he has times where he feels sad and depressed.  Current Symptoms/Problems: Patient states he regrets taking pills and feels fine emotionally.   Patient Reported Schizophrenia/Schizoaffective Diagnosis in Past: No   Strengths: No data recorded Preferences: No data recorded Abilities: No data recorded  Type of Services Patient Feels are Needed: No data recorded  Initial Clinical Notes/Concerns: No data recorded  Mental Health Symptoms Depression:  Change in energy/activity; Sleep (too much or little)   Duration of Depressive symptoms: Greater than two weeks   Mania:  None   Anxiety:   Difficulty concentrating; Worrying   Psychosis:  None   Duration of Psychotic symptoms: No data recorded  Trauma:  None   Obsessions:  None   Compulsions:  None   Inattention:  None   Hyperactivity/Impulsivity:  No data recorded  Oppositional/Defiant Behaviors:  None   Emotional Irregularity:  No data recorded  Other Mood/Personality Symptoms:  No data recorded   Mental Status Exam Appearance and self-care  Stature:  Average   Weight:  Average weight   Clothing:  Disheveled   Grooming:  Normal   Cosmetic use:  None   Posture/gait:  Normal   Motor activity:  Not Remarkable   Sensorium  Attention:  Normal   Concentration:  Normal   Orientation:  X5   Recall/memory:  Normal   Affect and Mood  Affect:  Anxious; Depressed   Mood:  Anxious; Depressed   Relating  Eye contact:  Normal   Facial expression:  Anxious; Depressed   Attitude toward examiner:  Cooperative   Thought and Language  Speech flow: Clear and Coherent   Thought content:  Appropriate to Mood and Circumstances   Preoccupation:  None   Hallucinations:  None   Organization:  No data recorded  [employer] of Knowledge:  Average   Intelligence:  Average    Abstraction:  No data recorded  Judgement:  Poor   Reality Testing:  Adequate   Insight:  Poor   Decision Making:  Impulsive   Social Functioning  Social Maturity:  No data recorded  Social Judgement:  "Street Smart"   Stress  Stressors:  Work; Housing   Coping Ability:  Human resources officer Deficits:  Building services engineer   Supports:  Family     Religion:    Leisure/Recreation:    Exercise/Diet: Exercise/Diet Have You Gained or Lost A Significant Amount of Weight in the Past Six Months?: No Do You Follow a Special Diet?: No Do You Have Any Trouble Sleeping?: No   CCA Employment/Education Employment/Work Situation: Employment / Work Situation Employment situation: Employed Where is patient currently employed?: [employer] long has patient been employed?: Patient reports he just started working 1 week ago.  Education: Education Is Patient Currently Attending School?: No   CCA Family/Childhood History Family and Relationship History: Family history Marital status: Single Does patient have children?: No  Childhood History:  Childhood History Does patient have siblings?: Yes Number of Siblings: 1  Child/Adolescent Assessment:     CCA Substance Use Alcohol/Drug Use: Alcohol / Drug Use Pain Medications: See MAR Prescriptions: See MAR Over the Counter: See MAR History of alcohol / drug use?: No history of alcohol / drug abuse                         ASAM's:  Six Dimensions of Multidimensional Assessment  Dimension 1:  Acute Intoxication and/or Withdrawal Potential:      Dimension 2:  Biomedical Conditions and Complications:      Dimension 3:  Emotional, Behavioral, or Cognitive Conditions and Complications:     Dimension 4:  Readiness to Change:     Dimension 5:  Relapse, Continued use, or Continued Problem Potential:     Dimension 6:  Recovery/Living Environment:     ASAM Severity Score:    ASAM Recommended Level of  Treatment:     Substance use Disorder (SUD)    Recommendations for Services/Supports/Treatments:    DSM5 Diagnoses: There are no problems to display for this patient.   Patient Centered Plan: Patient is on the following Treatment Plan(s):  Depression   Referrals to Alternative Service(s): Referred to Alternative Service(s):   Place:   Date:   Time:    Referred to Alternative Service(s):   Place:   Date:   Time:    Referred to Alternative Service(s):   Place:   Date:   Time:    Referred to Alternative Service(s):  Place:   Date:   Time:     Gerrald Basu Glennon Mac, Counselor, LCAS-A

## 2021-01-01 NOTE — ED Provider Notes (Signed)
  Physical Exam  BP (!) 137/96   Pulse 97   Temp 98.7 F (37.1 C) (Oral)   Resp 15   Ht 5\' 6"  (1.676 m)   Wt 77.1 kg   SpO2 97%   BMI 27.44 kg/m   Physical Exam  ED Course/Procedures     Procedures  MDM  12:00 AM  Assumed care.  Patient here after an intentional Tylenol overdose.  Reportedly overdosed on approximately 24 tablets of 500 mg of Tylenol around 9:30 PM last night and drink alcohol.  This was a suicide attempt.  Initial Tylenol level 26.  Repeat Tylenol level ordered for 1:30 AM.  3:45 AM  Pt's repeat Tylenol level drawn at 3 AM is 131.  Discussed with Patty with poison control who recommends giving bolus and infusion of N-acetylcysteine.  Patient did have activated charcoal upon arrival.  He is currently resting comfortably without any complaints.  Will discuss with hospitalist for admission.  4:33 AM Discussed patient's case with hospitalist, Dr. .  I have recommended admission and patient (and family if present) agree with this plan. Admitting physician will place admission orders.   I reviewed all nursing notes, vitals, pertinent previous records and reviewed/interpreted all EKGs, lab and urine results, imaging (as available).    CRITICAL CARE Performed by: [facility]   Total critical care time: 35 minutes  Critical care time was exclusive of separately billable procedures and treating other patients.  Critical care was necessary to treat or prevent imminent or life-threatening deterioration.  Critical care was time spent personally by me on the following activities: development of treatment plan with patient and/or surrogate as well as nursing, discussions with consultants, evaluation of patient's response to treatment, examination of patient, obtaining history from patient or surrogate, ordering and performing treatments and interventions, ordering and review of laboratory studies, ordering and review of radiographic studies, pulse oximetry and  re-evaluation of patient's condition.    Tavarion Babington, Rochele Raring, DO 01/01/21 [phone]

## 2021-01-01 NOTE — Consult Note (Signed)
[facility] Face-to-Face Psychiatry Consult   Reason for Consult:  Suicide attempt Referring Physician:  Dr Clyde Lundborg Patient Identification: Brent Stone MRN:  [id number] Principal Diagnosis: Major depressive disorder, recurrent severe without psychotic features (HCC) Diagnosis:  Principal Problem:   Major depressive disorder, recurrent severe without psychotic features (HCC) Active Problems:   Acetaminophen overdose, intentional self-harm, initial encounter (HCC)   Hypokalemia   Tobacco abuse   Suicide ideation   Alcohol use   Total Time spent with patient: 45 minutes  Subjective:   Brent Stone is a 30 y.o. male patient stated "I took a bottle of pills."  HPI:  30 yo male overdosed on acetaminophen in an attempt to end his life related to an "accumulation of a lot of things.  I regretted it almost immediately."  He remains depressed and will not expand on what his stressors are.  He lives with his sister and this is going fairly well. Brent Stone reports he does not have a support system as he does not talk to anyone.  Denies past suicide attempts and psychiatric admissions.  He states he drinks 2-4 times a week approximately 24 oz of beer.  No vaping, nicotine, or cannabis use.  No withdrawal symptoms, psychosis, homicidal ideations.  He is concerned about missing work on Monday and not happy with the plan to admit to the behavioral health unit when he is medically cleared.  He is on the list to admit when a bed is available at this time when he does clear medically.  Past Psychiatric History: depression, alcohol abuse  Risk to Self:  yes Risk to Others:  none Prior Inpatient Therapy:  none Prior Outpatient Therapy:  none  Past Medical History:  Past Medical History:  Diagnosis Date  . Alcohol use   . Tobacco abuse    History reviewed. No pertinent surgical history. Family History: History reviewed. No pertinent family history. Family Psychiatric  History: none Social History:  Social  History   Substance and Sexual Activity  Alcohol Use Yes  . Alcohol/week: 3.0 standard drinks  . Types: 3 Cans of beer per week   Comment: Three 24oz cans of beer per pt.      Social History   Substance and Sexual Activity  Drug Use No    Social History   Socioeconomic History  . Marital status: Single    Spouse name: Not on file  . Number of children: Not on file  . Years of education: Not on file  . Highest education level: Not on file  Occupational History  . Not on file  Tobacco Use  . Smoking status: Current Every Day Smoker    Packs/day: 1.00    Types: Cigarettes  . Smokeless tobacco: Never Used  Vaping Use  . Vaping Use: Never used  Substance and Sexual Activity  . Alcohol use: Yes    Alcohol/week: 3.0 standard drinks    Types: 3 Cans of beer per week    Comment: Three 24oz cans of beer per pt.   . Drug use: No  . Sexual activity: Not on file  Other Topics Concern  . Not on file  Social History Narrative  . Not on file   Social Determinants of Health   Financial Resource Strain: Not on file  Food Insecurity: Not on file  Transportation Needs: Not on file  Physical Activity: Not on file  Stress: Not on file  Social Connections: Not on file   Additional Social History:  Allergies:  No Known Allergies  Labs:  Results for orders placed or performed during the hospital encounter of 12/31/20 (from the past 48 hour(s))  Comprehensive metabolic panel     Status: Abnormal   Collection Time: 12/31/20 10:12 PM  Result Value Ref Range   Sodium 140 135 - 145 mmol/L   Potassium 3.3 (L) 3.5 - 5.1 mmol/L   Chloride 105 98 - 111 mmol/L   CO2 26 22 - 32 mmol/L   Glucose, Bld 99 70 - 99 mg/dL    Comment: Glucose reference range applies only to samples taken after fasting for at least 8 hours.   BUN 10 6 - 20 mg/dL   Creatinine, Ser 4.09 0.61 - 1.24 mg/dL   Calcium 9.1 8.9 - 81.1 mg/dL   Total Protein 7.3 6.5 - 8.1 g/dL   Albumin 4.0 3.5 - 5.0 g/dL   AST  34 15 - 41 U/L   ALT 37 0 - 44 U/L   Alkaline Phosphatase 65 38 - 126 U/L   Total Bilirubin 0.7 0.3 - 1.2 mg/dL   GFR, Estimated >91 >47 mL/min    Comment: (NOTE) Calculated using the CKD-EPI Creatinine Equation (2021)    Anion gap 9 5 - 15    Comment: Performed at [facility], [address] [place], [place] [address]  Ethanol     Status: Abnormal   Collection Time: 12/31/20 10:12 PM  Result Value Ref Range   Alcohol, Ethyl (B) 75 (H) <10 mg/dL    Comment: (NOTE) Lowest detectable limit for serum alcohol is 10 mg/dL.  For medical purposes only. Performed at [facility], [address] [place], [place] [address]   Salicylate level     Status: Abnormal   Collection Time: 12/31/20 10:12 PM  Result Value Ref Range   Salicylate Lvl <7.0 (L) 7.0 - 30.0 mg/dL    Comment: Performed at [facility], [address] [place], [place] [address]  Acetaminophen level     Status: None   Collection Time: 12/31/20 10:12 PM  Result Value Ref Range   Acetaminophen (Tylenol), Serum 26 10 - 30 ug/mL    Comment: (NOTE) Therapeutic concentrations vary significantly. A range of 10-30 ug/mL  may be an effective concentration for many patients. However, some  are best treated at concentrations outside of this range. Acetaminophen concentrations >150 ug/mL at 4 hours after ingestion  and >50 ug/mL at 12 hours after ingestion are often associated with  toxic reactions.  Performed at [facility], [address] [place], [place] [address]   cbc     Status: None   Collection Time: 12/31/20 10:12 PM  Result Value Ref Range   WBC 8.9 4.0 - 10.5 K/uL   RBC 4.78 4.22 - 5.81 MIL/uL   Hemoglobin 14.7 13.0 - 17.0 g/dL   HCT 52.8 41.3 - 24.4 %   MCV 87.0 80.0 - 100.0 fL   MCH 30.8 26.0 - 34.0 pg   MCHC 35.3 30.0 - 36.0 g/dL   RDW 01.0 27.2 - 53.6 %   Platelets 399 150 - 400 K/uL   nRBC 0.0 0.0 - 0.2 %    Comment: Performed at [facility], [address] [place], [place] [address]  CBG monitoring, ED     Status: None   Collection Time: 12/31/20 10:27 PM  Result Value Ref Range   Glucose-Capillary 88 70 - 99 mg/dL    Comment: Glucose reference range applies only to samples  taken after fasting for at least 8 hours.  Resp Panel by RT-PCR (Flu A&B, Covid) Nasopharyngeal Swab     Status: None   Collection Time: 12/31/20 10:49 PM   Specimen: Nasopharyngeal Swab; Nasopharyngeal(NP) swabs in vial transport medium  Result Value Ref Range   SARS Coronavirus 2 by RT PCR NEGATIVE NEGATIVE    Comment: (NOTE) SARS-CoV-2 target nucleic acids are NOT DETECTED.  The SARS-CoV-2 RNA is generally detectable in upper respiratory specimens during the acute phase of infection. The lowest concentration of SARS-CoV-2 viral copies this assay can detect is 138 copies/mL. A negative result does not preclude SARS-Cov-2 infection and should not be used as the sole basis for treatment or other patient management decisions. A negative result may occur with  improper specimen collection/handling, submission of specimen other than nasopharyngeal swab, presence of viral mutation(s) within the areas targeted by this assay, and inadequate number of viral copies(<138 copies/mL). A negative result must be combined with clinical observations, patient history, and epidemiological information. The expected result is Negative.  Fact Sheet for Patients:  BloggerCourse.comhttps://www.fda.gov/media/152166/download  Fact Sheet for Healthcare Providers:  SeriousBroker.ithttps://www.fda.gov/media/152162/download  This test is no t yet approved or cleared by the [place] FDA and  has been authorized for detection and/or diagnosis of SARS-CoV-2 by FDA under an Emergency Use Authorization (EUA). This EUA will remain  in effect (meaning this test can be used) for the duration of the COVID-19 declaration under Section 564(b)(1) of the Act, 21 U.S.C.section 360bbb-3(b)(1), unless the authorization  is terminated  or revoked sooner.       Influenza A by PCR NEGATIVE NEGATIVE   Influenza B by PCR NEGATIVE NEGATIVE    Comment: (NOTE) The Xpert Xpress SARS-CoV-2/FLU/RSV plus assay is intended as an aid in the diagnosis of influenza from Nasopharyngeal swab specimens and should not be used as a sole basis for treatment. Nasal washings and aspirates are unacceptable for Xpert Xpress SARS-CoV-2/FLU/RSV testing.  Fact Sheet for Patients: BloggerCourse.comhttps://www.fda.gov/media/152166/download  Fact Sheet for Healthcare Providers: SeriousBroker.ithttps://www.fda.gov/media/152162/download  This test is not yet approved or cleared by the [place] FDA and has been authorized for detection and/or diagnosis of SARS-CoV-2 by FDA under an Emergency Use Authorization (EUA). This EUA will remain in effect (meaning this test can be used) for the duration of the COVID-19 declaration under Section 564(b)(1) of the Act, 21 U.S.C. section 360bbb-3(b)(1), unless the authorization is terminated or revoked.  Performed at [facility], [address] [place], [place] [address]   Acetaminophen level     Status: Abnormal   Collection Time: 01/01/21  3:07 AM  Result Value Ref Range   Acetaminophen (Tylenol), Serum 131 (H) 10 - 30 ug/mL    Comment: (NOTE) Therapeutic concentrations vary significantly. A range of 10-30 ug/mL  may be an effective concentration for many patients. However, some  are best treated at concentrations outside of this range. Acetaminophen concentrations >150 ug/mL at 4 hours after ingestion  and >50 ug/mL at 12 hours after ingestion are often associated with  toxic reactions.  Performed at [facility], [address] [place], [place] [address]   Magnesium     Status: None   Collection Time: 01/01/21  4:12 AM  Result Value Ref Range   Magnesium 1.9 1.7 - 2.4 mg/dL    Comment: Performed at [facility], [address] [place], [place] [address]  Protime-INR      Status: Abnormal   Collection Time: 01/01/21  6:19 AM  Result Value  Ref Range   Prothrombin Time 15.4 (H) 11.4 - 15.2 seconds   INR 1.2 0.8 - 1.2    Comment: (NOTE) INR goal varies based on device and disease states. Performed at [facility], [address] [place], [place] [address]   Urine Drug Screen, Qualitative     Status: None   Collection Time: 01/01/21  8:22 AM  Result Value Ref Range   Tricyclic, Ur Screen NONE DETECTED NONE DETECTED   Amphetamines, Ur Screen NONE DETECTED NONE DETECTED   MDMA (Ecstasy)Ur Screen NONE DETECTED NONE DETECTED   Cocaine Metabolite,Ur [place] NONE DETECTED NONE DETECTED   Opiate, Ur Screen NONE DETECTED NONE DETECTED   Phencyclidine (PCP) Ur S NONE DETECTED NONE DETECTED   Cannabinoid 50 Ng, Ur [place] NONE DETECTED NONE DETECTED   Barbiturates, Ur Screen NONE DETECTED NONE DETECTED   Benzodiazepine, Ur Scrn NONE DETECTED NONE DETECTED   Methadone Scn, Ur NONE DETECTED NONE DETECTED    Comment: (NOTE) Tricyclics + metabolites, urine    Cutoff 1000 ng/mL Amphetamines + metabolites, urine  Cutoff 1000 ng/mL MDMA (Ecstasy), urine              Cutoff 500 ng/mL Cocaine Metabolite, urine          Cutoff 300 ng/mL Opiate + metabolites, urine        Cutoff 300 ng/mL Phencyclidine (PCP), urine         Cutoff 25 ng/mL Cannabinoid, urine                 Cutoff 50 ng/mL Barbiturates + metabolites, urine  Cutoff 200 ng/mL Benzodiazepine, urine              Cutoff 200 ng/mL Methadone, urine                   Cutoff 300 ng/mL  The urine drug screen provides only a preliminary, unconfirmed analytical test result and should not be used for non-medical purposes. Clinical consideration and professional judgment should be applied to any positive drug screen result due to possible interfering substances. A more specific alternate chemical method must be used in order to obtain a confirmed analytical result. Gas chromatography / mass spectrometry (GC/MS) is  the preferred confirm atory method. Performed at [facility], [address] [place], [place] [address]   Glucose, capillary     Status: Abnormal   Collection Time: 01/01/21  8:32 AM  Result Value Ref Range   Glucose-Capillary 102 (H) 70 - 99 mg/dL    Comment: Glucose reference range applies only to samples taken after fasting for at least 8 hours.  Phosphorus     Status: None   Collection Time: 01/01/21 11:45 AM  Result Value Ref Range   Phosphorus 2.8 2.5 - 4.6 mg/dL    Comment: Performed at [facility], [address] [place], [place] [address]  Acetaminophen level     Status: None   Collection Time: 01/01/21  2:09 PM  Result Value Ref Range   Acetaminophen (Tylenol), Serum 15 10 - 30 ug/mL    Comment: (NOTE) Therapeutic concentrations vary significantly. A range of 10-30 ug/mL  may be an effective concentration for many patients. However, some  are best treated at concentrations outside of this range. Acetaminophen concentrations >150 ug/mL at 4 hours after ingestion  and >50 ug/mL at 12 hours after ingestion are often associated with  toxic reactions.  Performed at [facility], [address], [place], [place] [address]  Current Facility-Administered Medications  Medication Dose Route Frequency Provider Last Rate Last Admin  . acetylcysteine (ACETADOTE) 24,000 mg in dextrose 5 % 600 mL (40 mg/mL) infusion  15 mg/kg/hr Intravenous Continuous Lorretta Harp, MD 28.9 mL/hr at 01/01/21 0850 15 mg/kg/hr at 01/01/21 0850  . enoxaparin (LOVENOX) injection 40 mg  40 mg Subcutaneous Q24H Lorretta Harp, MD      . folic acid (FOLVITE) tablet 1 mg  1 mg Oral Daily Lorretta Harp, MD   1 mg at 01/01/21 0849  . folic acid (FOLVITE) tablet 1 mg  1 mg Oral Daily Lorretta Harp, MD   1 mg at 01/01/21 1422  . ibuprofen (ADVIL) tablet 200 mg  200 mg Oral Q6H PRN Lorretta Harp, MD      . LORazepam (ATIVAN) injection 0-4 mg  0-4 mg Intravenous Q6H Lorretta Harp, MD        Followed by  . [START ON 01/03/2021] LORazepam (ATIVAN) injection 0-4 mg  0-4 mg Intravenous Q12H Lorretta Harp, MD      . LORazepam (ATIVAN) tablet 1-4 mg  1-4 mg Oral Q1H PRN Lorretta Harp, MD       Or  . LORazepam (ATIVAN) injection 1-4 mg  1-4 mg Intravenous Q1H PRN Lorretta Harp, MD      . multivitamin with minerals tablet 1 tablet  1 tablet Oral Daily Lorretta Harp, MD   1 tablet at 01/01/21 1422  . nicotine (NICODERM CQ - dosed in mg/24 hours) patch 21 mg  21 mg Transdermal Daily Lorretta Harp, MD   21 mg at 01/01/21 0849  . ondansetron (ZOFRAN) injection 4 mg  4 mg Intravenous Q8H PRN Lorretta Harp, MD      . Melene Muller ON 01/02/2021] pneumococcal 23 valent vaccine (PNEUMOVAX-23) injection 0.5 mL  0.5 mL Intramuscular Tomorrow-1000 Lorretta Harp, MD      . Melene Muller ON 01/02/2021] thiamine tablet 100 mg  100 mg Oral Daily Lorretta Harp, MD       Or  . Melene Muller ON 01/02/2021] thiamine (B-1) injection 100 mg  100 mg Intravenous Daily Lorretta Harp, MD        Musculoskeletal: Strength & Muscle Tone: within normal limits Gait & Station: normal Patient leans: N/A  Psychiatric Specialty Exam: Physical Exam Vitals and nursing note reviewed.  Constitutional:      Appearance: Normal appearance.  HENT:     Head: Normocephalic.     Nose: Nose normal.  Pulmonary:     Effort: Pulmonary effort is normal.  Musculoskeletal:        General: Normal range of motion.     Cervical back: Normal range of motion.  Neurological:     General: No focal deficit present.     Mental Status: He is alert.  Psychiatric:        Attention and Perception: Attention and perception normal.        Mood and Affect: Mood is depressed.        Speech: Speech normal.        Behavior: Behavior normal. Behavior is cooperative.     Review of Systems  Blood pressure 133/86, pulse 71, temperature 98.1 F (36.7 C), temperature source Oral, resp. rate 16, height  (1.676 m), weight 76.1 kg, SpO2 97 %.Body mass index is 27.08 kg/m.  General  Appearance: Casual  Eye Contact:  Fair  Speech:  Normal Rate  Volume:  Normal  Mood:  Depressed  Affect:  Congruent  Thought Process:  Coherent and Descriptions of Associations: Intact  Orientation:  Full (Time, Place, and Person)  Thought Content:  WDL and Logical  Suicidal Thoughts:  Yes.  with intent/plan  Homicidal Thoughts:  No  Memory:  Immediate;   Fair Recent;   Fair Remote;   Fair  Judgement:  Poor  Insight:  Lacking  Psychomotor Activity:  Decreased  Concentration:  Concentration: Fair and Attention Span: Fair  Recall:  [employer] of Knowledge:  Good  Language:  Fair  Akathisia:  No  Handed:  Right  AIMS (if indicated):     Assets:  Housing Leisure Time Physical Health Resilience Social Support Vocational/Educational  ADL's:  Intact  Cognition:  WNL  Sleep:      Physical Exam: Physical Exam Vitals and nursing note reviewed.  Constitutional:      Appearance: Normal appearance.  HENT:     Head: Normocephalic.     Nose: Nose normal.  Pulmonary:     Effort: Pulmonary effort is normal.  Musculoskeletal:        General: Normal range of motion.     Cervical back: Normal range of motion.  Neurological:     General: No focal deficit present.     Mental Status: He is alert.  Psychiatric:        Attention and Perception: Attention and perception normal.        Mood and Affect: Mood is depressed.        Speech: Speech normal.        Behavior: Behavior normal. Behavior is cooperative.    ROS Blood pressure 133/86, pulse 71, temperature 98.1 F (36.7 C), temperature source Oral, resp. rate 16, height 5\' 6"  (1.676 m), weight 76.1 kg, SpO2 97 %. Body mass index is 27.08 kg/m.  Treatment Plan Summary: Major depressive disorder, recurrent, severe without psychosis: -Admit to the behavioral health unit at [facility] when medically clear and bed available. -Medications will be considered when he is medically cleared for depression  Alcohol abuse: -Continue the  Ativan alcohol detox protocol  Disposition: Recommend psychiatric Inpatient admission when medically cleared.  [facility], NP 01/01/2021 4:27 PM

## 2021-01-01 NOTE — H&P (Signed)
History and Physical    Brent Stone ZHY:[id number] DOB: 03/13/1991 DOA: 12/31/2020  Referring MD/NP/PA:   PCP: Patient, No Pcp Per (Inactive)   Patient coming from:  The patient is coming from home.  At baseline, pt is independent for most of ADL.        Chief Complaint: tylenol overdose  HPI: Brent BattenCaleb P Stone is a 30 y.o. male with medical history significant of tobacco abuse, alcohol use, who presents with tylenol overdose.  Pt states that he intentionally overdosed approximately 24 tablets of 500 mg of Tylenol around 9:30 PM last night.This was a suicide attempt. He states that he drank some alcohol last night. When I saw pt in ED, he is asymptomatic.  He denies any homicidal ideations.  He states that he regrets for what he did. Currently he denies more suicidal ideation.  Patient denies chest pain, cough, shortness of breath.  No fever or chills.  No nausea, vomiting, diarrhea or abdominal pain.  No symptoms of UTI.  Patient states that he drinks beer 3 times per week, a big can of beer each time. Pt was found to have Tylenol level 26, then up to 131.  Poison control recommended to start IV acetylcysteine. Pt was given activated charcoal upon arrival to ED.   ED Course: pt was found to have alcohol level of 75, negative COVID PCR, WBC 8.9, INR 1.2, negative UDS, salicylate level less than 7, potassium 3.3, renal function okay, liver function normal, temperature normal, blood pressure 129/91, heart rate 78, RR 18, oxygen saturation 95% on room air.  Patient is IVC'ed. Consulted NP, Celso AmyJamie  Lord of psychiatry.  Review of Systems:   General: no fevers, chills, no body weight gain, fatigue HEENT: no blurry vision, hearing changes or sore throat Respiratory: no dyspnea, coughing, wheezing CV: no chest pain, no palpitations GI: no nausea, vomiting, abdominal pain, diarrhea, constipation GU: no dysuria, burning on urination, increased urinary frequency, hematuria  Ext: no leg edema Neuro:  no unilateral weakness, numbness, or tingling, no vision change or hearing loss Skin: no rash, no skin tear. MSK: No muscle spasm, no deformity, no limitation of range of movement in spin Heme: No easy bruising.  Travel history: No recent long distant travel. Psychiatry: Patient had suicidal attempt, currently no homicidal ideations.  Allergy: No Known Allergies  Past Medical History:  Diagnosis Date  . Alcohol use   . Tobacco abuse     History reviewed. No pertinent surgical history.  Social History:  reports that he has been smoking cigarettes. He has been smoking about 1.00 pack per day. He has never used smokeless tobacco. He reports current alcohol use of about 3.0 standard drinks of alcohol per week. He reports that he does not use drugs.  Family History: I have reviewed with patient, but patient states that he does not know the detailed information about family medical history, but nobody was recently hospitalized.  Prior to Admission medications   Not on File    Physical Exam: Vitals:   01/01/21 0300 01/01/21 0525 01/01/21 0647 01/01/21 0850  BP: 98/67 126/87 (!) 129/91 (!) 139/91  Pulse: 77 77 78 72  Resp: 14 16 16 17   Temp:   98 F (36.7 C) 97.9 F (36.6 C)  TempSrc:    Oral  SpO2: 96% 99% 99% 99%  Weight:   76.1 kg   Height:   5\' 6"  (1.676 m)    General: Not in acute distress HEENT:  Eyes: PERRL, EOMI, no scleral icterus.       ENT: No discharge from the ears and nose, no pharynx injection, no tonsillar enlargement.        Neck: No JVD, no bruit, no mass felt. Heme: No neck lymph node enlargement. Cardiac: S1/S2, RRR, No murmurs, No gallops or rubs. Respiratory: No rales, wheezing, rhonchi or rubs. GI: Soft, nondistended, nontender, no rebound pain, no organomegaly, BS present. GU: No hematuria Ext: No pitting leg edema bilaterally. 1+DP/PT pulse bilaterally. Musculoskeletal: No joint deformities, No joint redness or warmth, no limitation of ROM in  spin. Skin: No rashes.  Neuro: Alert, oriented X3, cranial nerves II-XII grossly intact, moves all extremities normally.  Psych: Patient had suicidal attempt, but currently denies suicidal or homicidal ideations.  Labs on Admission: I have personally reviewed following labs and imaging studies  CBC: Recent Labs  Lab 12/31/20 2212  WBC 8.9  HGB 14.7  HCT 41.6  MCV 87.0  PLT 399   Basic Metabolic Panel: Recent Labs  Lab 12/31/20 2212 01/01/21 0412  NA 140  --   K 3.3*  --   CL 105  --   CO2 26  --   GLUCOSE 99  --   BUN 10  --   CREATININE 0.83  --   CALCIUM 9.1  --   MG  --  1.9   GFR: Estimated Creatinine Clearance: 119.6 mL/min (by C-G formula based on SCr of 0.83 mg/dL). Liver Function Tests: Recent Labs  Lab 12/31/20 2212  AST 34  ALT 37  ALKPHOS 65  BILITOT 0.7  PROT 7.3  ALBUMIN 4.0   No results for input(s): LIPASE, AMYLASE in the last 168 hours. No results for input(s): AMMONIA in the last 168 hours. Coagulation Profile: Recent Labs  Lab 01/01/21 0619  INR 1.2   Cardiac Enzymes: No results for input(s): CKTOTAL, CKMB, CKMBINDEX, TROPONINI in the last 168 hours. BNP (last 3 results) No results for input(s): PROBNP in the last 8760 hours. HbA1C: No results for input(s): HGBA1C in the last 72 hours. CBG: Recent Labs  Lab 12/31/20 2227 01/01/21 0832  GLUCAP 88 102*   Lipid Profile: No results for input(s): CHOL, HDL, LDLCALC, TRIG, CHOLHDL, LDLDIRECT in the last 72 hours. Thyroid Function Tests: No results for input(s): TSH, T4TOTAL, FREET4, T3FREE, THYROIDAB in the last 72 hours. Anemia Panel: No results for input(s): VITAMINB12, FOLATE, FERRITIN, TIBC, IRON, RETICCTPCT in the last 72 hours. Urine analysis:    Component Value Date/Time   COLORURINE Yellow 11/21/2014 1209   APPEARANCEUR Clear 11/21/2014 1209   LABSPEC 1.014 11/21/2014 1209   PHURINE 6.0 11/21/2014 1209   GLUCOSEU Negative 11/21/2014 1209   HGBUR Negative 11/21/2014  1209   BILIRUBINUR Negative 11/21/2014 1209   KETONESUR Negative 11/21/2014 1209   PROTEINUR Negative 11/21/2014 1209   NITRITE Negative 11/21/2014 1209   LEUKOCYTESUR Negative 11/21/2014 1209   Sepsis Labs: @LABRCNTIP (procalcitonin:4,lacticidven:4) ) Recent Results (from the past 240 hour(s))  Resp Panel by RT-PCR (Flu A&B, Covid) Nasopharyngeal Swab     Status: None   Collection Time: 12/31/20 10:49 PM   Specimen: Nasopharyngeal Swab; Nasopharyngeal(NP) swabs in vial transport medium  Result Value Ref Range Status   SARS Coronavirus 2 by RT PCR NEGATIVE NEGATIVE Final    Comment: (NOTE) SARS-CoV-2 target nucleic acids are NOT DETECTED.  The SARS-CoV-2 RNA is generally detectable in upper respiratory specimens during the acute phase of infection. The lowest concentration of SARS-CoV-2 viral copies this assay can detect  is 138 copies/mL. A negative result does not preclude SARS-Cov-2 infection and should not be used as the sole basis for treatment or other patient management decisions. A negative result may occur with  improper specimen collection/handling, submission of specimen other than nasopharyngeal swab, presence of viral mutation(s) within the areas targeted by this assay, and inadequate number of viral copies(<138 copies/mL). A negative result must be combined with clinical observations, patient history, and epidemiological information. The expected result is Negative.  Fact Sheet for Patients:  BloggerCourse.com  Fact Sheet for Healthcare Providers:  SeriousBroker.it  This test is no t yet approved or cleared by the [place] FDA and  has been authorized for detection and/or diagnosis of SARS-CoV-2 by FDA under an Emergency Use Authorization (EUA). This EUA will remain  in effect (meaning this test can be used) for the duration of the COVID-19 declaration under Section 564(b)(1) of the Act, 21 U.S.C.section  360bbb-3(b)(1), unless the authorization is terminated  or revoked sooner.       Influenza A by PCR NEGATIVE NEGATIVE Final   Influenza B by PCR NEGATIVE NEGATIVE Final    Comment: (NOTE) The Xpert Xpress SARS-CoV-2/FLU/RSV plus assay is intended as an aid in the diagnosis of influenza from Nasopharyngeal swab specimens and should not be used as a sole basis for treatment. Nasal washings and aspirates are unacceptable for Xpert Xpress SARS-CoV-2/FLU/RSV testing.  Fact Sheet for Patients: BloggerCourse.com  Fact Sheet for Healthcare Providers: SeriousBroker.it  This test is not yet approved or cleared by the [place] FDA and has been authorized for detection and/or diagnosis of SARS-CoV-2 by FDA under an Emergency Use Authorization (EUA). This EUA will remain in effect (meaning this test can be used) for the duration of the COVID-19 declaration under Section 564(b)(1) of the Act, 21 U.S.C. section 360bbb-3(b)(1), unless the authorization is terminated or revoked.  Performed at [facility], [address] [place], [place] [address]      Radiological Exams on Admission: No results found.   EKG: I have personally reviewed.  Sinus rhythm, QTC 414, possible LAE.  Assessment/Plan Principal Problem:   Tylenol overdose Active Problems:   Hypokalemia   Tobacco abuse   Suicide ideation   Alcohol use   Tylenol overdose and suicide ideation/attempt: tylenol level up 26 --> 131.  Liver function normal.  INR 1.2.  Poison control recommended to start IV acetylcysteine. Consulted NP, Celso Amy of psychiatry  -Will admit to med-surg bed as inpt -Started on  N-acetylcysteine per pharm -repeat Tylenol level -CMP in AM -Supportive care -Sitter at bedside  Hypokalemia: K 3.3. Mg 1.9 -Repleted potassium -Check phosphorus level  Tobacco abuse and alcohol use: -Nicotine patch -CIWA protocol    DVT ppx:  SQ  Lovenox Code Status: Full code Family Communication: not done, no family member is at bed side.  I have tried to call his father without success, I left a message Disposition Plan:  Anticipate discharge back to previous environment Consults called:  Consulted NP, Celso Amy of psychiatry Admission status and Level of care: Med-Surg:    as inpt         Status is: Inpatient  Remains inpatient appropriate because:Inpatient level of care appropriate due to severity of illness   Dispo: The patient is from: Home              Anticipated d/c is to: to be determined              Patient  currently is not medically stable to d/c.   Difficult to place patient No          Date of Service 01/01/2021    Lorretta Harp [facility]   If 7PM-7AM, please contact night-coverage [email or web address] 01/01/2021, 11:42 AM

## 2021-01-02 ENCOUNTER — Inpatient Hospital Stay
Admission: AD | Admit: 2021-01-02 | Discharge: 2021-01-04 | DRG: 885 | Disposition: A | Discharge status: Home / Self Care | Payer: Self-pay | Attending: Behavioral Health | Admitting: Behavioral Health

## 2021-01-02 ENCOUNTER — Other Ambulatory Visit: Payer: Self-pay

## 2021-01-02 ENCOUNTER — Encounter: Payer: Self-pay | Admitting: Psychiatry

## 2021-01-02 DIAGNOSIS — F332 Major depressive disorder, recurrent severe without psychotic features: Principal | ICD-10-CM | POA: Diagnosis present

## 2021-01-02 DIAGNOSIS — T391X2A Poisoning by 4-Aminophenol derivatives, intentional self-harm, initial encounter: Secondary | ICD-10-CM | POA: Diagnosis present

## 2021-01-02 DIAGNOSIS — Z789 Other specified health status: Secondary | ICD-10-CM

## 2021-01-02 DIAGNOSIS — F17210 Nicotine dependence, cigarettes, uncomplicated: Secondary | ICD-10-CM | POA: Diagnosis present

## 2021-01-02 DIAGNOSIS — Z7289 Other problems related to lifestyle: Secondary | ICD-10-CM

## 2021-01-02 DIAGNOSIS — Z9151 Personal history of suicidal behavior: Secondary | ICD-10-CM

## 2021-01-02 DIAGNOSIS — Z79899 Other long term (current) drug therapy: Secondary | ICD-10-CM

## 2021-01-02 DIAGNOSIS — F4010 Social phobia, unspecified: Secondary | ICD-10-CM | POA: Diagnosis present

## 2021-01-02 DIAGNOSIS — F1090 Alcohol use, unspecified, uncomplicated: Secondary | ICD-10-CM

## 2021-01-02 LAB — CBC
HCT: 41.9 % (ref 39.0–52.0)
Hemoglobin: 14.9 g/dL (ref 13.0–17.0)
MCH: 31.2 pg (ref 26.0–34.0)
MCHC: 35.6 g/dL (ref 30.0–36.0)
MCV: 87.7 fL (ref 80.0–100.0)
Platelets: 333 10*3/uL (ref 150–400)
RBC: 4.78 MIL/uL (ref 4.22–5.81)
RDW: 12.3 % (ref 11.5–15.5)
WBC: 7.6 10*3/uL (ref 4.0–10.5)
nRBC: 0 % (ref 0.0–0.2)

## 2021-01-02 LAB — COMPREHENSIVE METABOLIC PANEL
ALT: 31 U/L (ref 0–44)
AST: 25 U/L (ref 15–41)
Albumin: 3.2 g/dL — ABNORMAL LOW (ref 3.5–5.0)
Alkaline Phosphatase: 46 U/L (ref 38–126)
Anion gap: 8 (ref 5–15)
BUN: 7 mg/dL (ref 6–20)
CO2: 24 mmol/L (ref 22–32)
Calcium: 8.4 mg/dL — ABNORMAL LOW (ref 8.9–10.3)
Chloride: 107 mmol/L (ref 98–111)
Creatinine, Ser: 0.46 mg/dL — ABNORMAL LOW (ref 0.61–1.24)
GFR, Estimated: >60 mL/min (ref >60)
Glucose, Bld: 94 mg/dL (ref 70–99)
Potassium: 3.8 mmol/L (ref 3.5–5.1)
Sodium: 139 mmol/L (ref 135–145)
Total Bilirubin: 0.7 mg/dL (ref 0.3–1.2)
Total Protein: 6.6 g/dL (ref 6.5–8.1)

## 2021-01-02 LAB — HIV ANTIBODY (ROUTINE TESTING W REFLEX): HIV Screen 4th Generation wRfx: NONREACTIVE

## 2021-01-02 LAB — ACETAMINOPHEN LEVEL: Acetaminophen (Tylenol), Serum: <10 ug/mL — ABNORMAL LOW (ref 10–30)

## 2021-01-02 MED ORDER — ADULT MULTIVITAMIN W/MINERALS CH
1.0000 | ORAL_TABLET | Freq: Every day | ORAL | Status: DC
  Administered 2021-01-03 – 2021-01-04 (×2): 1 via ORAL
  Filled 2021-01-02 (×2): qty 1

## 2021-01-02 MED ORDER — ONDANSETRON HCL 4 MG/2ML IJ SOLN
4.0000 mg | Freq: Three times a day (TID) | INTRAMUSCULAR | Status: DC | PRN
  Filled 2021-01-02: qty 2

## 2021-01-02 MED ORDER — IBUPROFEN 200 MG PO TABS: 200.0000 mg | ORAL_TABLET | Freq: Four times a day (QID) | ORAL | 0 refills | Status: DC | PRN

## 2021-01-02 MED ORDER — ACETAMINOPHEN 325 MG PO TABS: 650.0000 mg | ORAL_TABLET | Freq: Four times a day (QID) | ORAL | Status: DC | PRN

## 2021-01-02 MED ORDER — NICOTINE 21 MG/24HR TD PT24
21.0000 mg | MEDICATED_PATCH | Freq: Every day | TRANSDERMAL | Status: DC
  Administered 2021-01-03 – 2021-01-04 (×2): 21 mg via TRANSDERMAL
  Filled 2021-01-02 (×2): qty 1

## 2021-01-02 MED ORDER — ALUM & MAG HYDROXIDE-SIMETH 200-200-20 MG/5ML PO SUSP: 30.0000 mL | ORAL | Status: DC | PRN

## 2021-01-02 MED ORDER — ADULT MULTIVITAMIN W/MINERALS CH: 1.0000 | ORAL_TABLET | Freq: Every day | ORAL | 0 refills | Status: AC

## 2021-01-02 MED ORDER — MAGNESIUM HYDROXIDE 400 MG/5ML PO SUSP: 30.0000 mL | Freq: Every day | ORAL | Status: DC | PRN

## 2021-01-02 MED ORDER — IBUPROFEN 200 MG PO TABS: 200.0000 mg | ORAL_TABLET | Freq: Four times a day (QID) | ORAL | Status: DC | PRN

## 2021-01-02 MED ORDER — LORAZEPAM 2 MG/ML IJ SOLN: 0.0000 mg | Freq: Two times a day (BID) | INTRAMUSCULAR | Status: DC

## 2021-01-02 MED ORDER — LORAZEPAM 2 MG/ML IJ SOLN: 1.0000 mg | INTRAMUSCULAR | Status: DC | PRN

## 2021-01-02 MED ORDER — FOLIC ACID 1 MG PO TABS: 1.0000 mg | ORAL_TABLET | Freq: Every day | ORAL | 0 refills | Status: AC

## 2021-01-02 MED ORDER — LORAZEPAM 2 MG/ML IJ SOLN: 0.0000 mg | Freq: Four times a day (QID) | INTRAMUSCULAR | Status: DC

## 2021-01-02 MED ORDER — THIAMINE HCL 100 MG PO TABS: 100.0000 mg | ORAL_TABLET | Freq: Every day | ORAL | 0 refills | Status: AC

## 2021-01-02 MED ORDER — NICOTINE 21 MG/24HR TD PT24: 21.0000 mg | MEDICATED_PATCH | Freq: Every day | TRANSDERMAL | 0 refills | Status: AC

## 2021-01-02 MED ORDER — FOLIC ACID 1 MG PO TABS
1.0000 mg | ORAL_TABLET | Freq: Every day | ORAL | Status: DC
  Administered 2021-01-03 – 2021-01-04 (×2): 1 mg via ORAL
  Filled 2021-01-02 (×2): qty 1

## 2021-01-02 MED ORDER — THIAMINE HCL 100 MG/ML IJ SOLN
100.0000 mg | Freq: Every day | INTRAMUSCULAR | Status: DC
  Filled 2021-01-02: qty 2

## 2021-01-02 MED ORDER — THIAMINE HCL 100 MG PO TABS
100.0000 mg | ORAL_TABLET | Freq: Every day | ORAL | Status: DC
  Administered 2021-01-03 – 2021-01-04 (×2): 100 mg via ORAL
  Filled 2021-01-02 (×2): qty 1

## 2021-01-02 MED ORDER — LORAZEPAM 1 MG PO TABS: 1.0000 mg | ORAL_TABLET | ORAL | Status: DC | PRN

## 2021-01-02 NOTE — Discharge Summary (Signed)
Physician Discharge Summary  Brent Stone [id number]:[id number] DOB: 11-Feb-1991 DOA: 12/31/2020  PCP: Patient, No Pcp Per (Inactive)  Admit date: 12/31/2020 Discharge date: 01/02/2021  Admitted From: Home  Disposition: BH  Recommendations for Outpatient Follow-up:  1. Follow up with PCP in 1-2 weeks 2. Please obtain BMP/CBC in one week 3. LFT in 3 days     Discharge Condition: Stable.  CODE STATUS: Full code Diet recommendation: Heart Healthy  Brief/Interim Summary: 30 year old with past medical history significant for alcohol use, tobacco abuse, intentional Tylenol overdose, suicidal attempt. Evaluated by  psychiatric who is recommending inpatient psych admission when medical clearance.  Patient was a started on IV fluids, he received activated charcoal in the ED, he was a started on acetylcysteine.  Tylenol level on admission 131.\   1-Intentional Tylenol overdose, suicidal attempt: -Patient presented with Tylenol overdose he took 24 tablets of 500 mg Tylenol around 9:30 PM, on 5/20.  -Received  charcoal in the ED. -Tylenol level on admission 131, liver function test stable. Tylenol down to less than 10. -Completed 20 hour protocol of acetylcysteine. Denies abdominal pain, nausea or vomiting. LFT normal.  -Stable to be transfer to BH>    2-Major depressive disorder, suicidal attempt: Patient will be admitted  to behavioral health  3-Hypokalemia: Resolved.   4-Tobacco/alcohol use abuse: -He was monitor on  CIWA protocol. No evidence of withdrawal symptoms.  -Nicotine patch    Discharge Diagnoses:  Principal Problem:   Major depressive disorder, recurrent severe without psychotic features (HCC) Active Problems:   Hypokalemia   Tobacco abuse   Suicide ideation   Alcohol use   Acetaminophen overdose, intentional self-harm, initial encounter [facility])    Discharge Instructions  Discharge Instructions    Diet - low sodium heart healthy   Complete by: As directed     Increase activity slowly   Complete by: As directed      Allergies as of 01/02/2021   No Known Allergies     Medication List    TAKE these medications   folic acid 1 MG tablet Commonly known as: FOLVITE Take 1 tablet (1 mg total) by mouth daily. Start taking on: Jan 03, 2021   ibuprofen 200 MG tablet Commonly known as: ADVIL Take 1 tablet (200 mg total) by mouth every 6 (six) hours as needed for headache or fever.   multivitamin with minerals Tabs tablet Take 1 tablet by mouth daily. Start taking on: Jan 03, 2021   nicotine 21 mg/24hr patch Commonly known as: NICODERM CQ - dosed in mg/24 hours Place 1 patch (21 mg total) onto the skin daily. Start taking on: Jan 03, 2021   thiamine 100 MG tablet Take 1 tablet (100 mg total) by mouth daily. Start taking on: Jan 03, 2021       No Known Allergies  Consultations:  Psych    Procedures/Studies:  No results found.   Subjective: He denies abdominal pain, nausea or vomiting.  He denies history of alcohol withdrawal symptoms.   Discharge Exam: Vitals:   01/02/21 0456 01/02/21 0801  BP: 124/77 123/89  Pulse: 61 70  Resp: 16 20  Temp: 97.7 F (36.5 C) 97.8 F (36.6 C)  SpO2: 97% 100%     General: Pt is alert, awake, not in acute distress Cardiovascular: RRR, S1/S2 +, no rubs, no gallops Respiratory: CTA bilaterally, no wheezing, no rhonchi Abdominal: Soft, NT, ND, bowel sounds + Extremities: no edema, no cyanosis    The results of significant diagnostics from this  hospitalization (including imaging, microbiology, ancillary and laboratory) are listed below for reference.     Microbiology: Recent Results (from the past 240 hour(s))  Resp Panel by RT-PCR (Flu A&B, Covid) Nasopharyngeal Swab     Status: None   Collection Time: 12/31/20 10:49 PM   Specimen: Nasopharyngeal Swab; Nasopharyngeal(NP) swabs in vial transport medium  Result Value Ref Range Status   SARS Coronavirus 2 by RT PCR NEGATIVE  NEGATIVE Final    Comment: (NOTE) SARS-CoV-2 target nucleic acids are NOT DETECTED.  The SARS-CoV-2 RNA is generally detectable in upper respiratory specimens during the acute phase of infection. The lowest concentration of SARS-CoV-2 viral copies this assay can detect is 138 copies/mL. A negative result does not preclude SARS-Cov-2 infection and should not be used as the sole basis for treatment or other patient management decisions. A negative result may occur with  improper specimen collection/handling, submission of specimen other than nasopharyngeal swab, presence of viral mutation(s) within the areas targeted by this assay, and inadequate number of viral copies(<138 copies/mL). A negative result must be combined with clinical observations, patient history, and epidemiological information. The expected result is Negative.  Fact Sheet for Patients:  BloggerCourse.com  Fact Sheet for Healthcare Providers:  SeriousBroker.it  This test is no t yet approved or cleared by the [place] FDA and  has been authorized for detection and/or diagnosis of SARS-CoV-2 by FDA under an Emergency Use Authorization (EUA). This EUA will remain  in effect (meaning this test can be used) for the duration of the COVID-19 declaration under Section 564(b)(1) of the Act, 21 U.S.C.section 360bbb-3(b)(1), unless the authorization is terminated  or revoked sooner.       Influenza A by PCR NEGATIVE NEGATIVE Final   Influenza B by PCR NEGATIVE NEGATIVE Final    Comment: (NOTE) The Xpert Xpress SARS-CoV-2/FLU/RSV plus assay is intended as an aid in the diagnosis of influenza from Nasopharyngeal swab specimens and should not be used as a sole basis for treatment. Nasal washings and aspirates are unacceptable for Xpert Xpress SARS-CoV-2/FLU/RSV testing.  Fact Sheet for Patients: BloggerCourse.com  Fact Sheet for Healthcare  Providers: SeriousBroker.it  This test is not yet approved or cleared by the [place] FDA and has been authorized for detection and/or diagnosis of SARS-CoV-2 by FDA under an Emergency Use Authorization (EUA). This EUA will remain in effect (meaning this test can be used) for the duration of the COVID-19 declaration under Section 564(b)(1) of the Act, 21 U.S.C. section 360bbb-3(b)(1), unless the authorization is terminated or revoked.  Performed at [facility], [address] [place], [place] [address]      Labs: BNP (last 3 results) No results for input(s): BNP in the last 8760 hours. Basic Metabolic Panel: Recent Labs  Lab 12/31/20 2212 01/01/21 0412 01/01/21 1145 01/02/21 0625  NA 140  --   --  139  K 3.3*  --   --  3.8  CL 105  --   --  107  CO2 26  --   --  24  GLUCOSE 99  --   --  94  BUN 10  --   --  7  CREATININE 0.83  --   --  0.46*  CALCIUM 9.1  --   --  8.4*  MG  --  1.9  --   --   PHOS  --   --  2.8  --    Liver Function Tests: Recent Labs  Lab 12/31/20 2212 01/02/21 6659  AST 34 25  ALT 37 31  ALKPHOS 65 46  BILITOT 0.7 0.7  PROT 7.3 6.6  ALBUMIN 4.0 3.2*   No results for input(s): LIPASE, AMYLASE in the last 168 hours. No results for input(s): AMMONIA in the last 168 hours. CBC: Recent Labs  Lab 12/31/20 2212 01/02/21 0625  WBC 8.9 7.6  HGB 14.7 14.9  HCT 41.6 41.9  MCV 87.0 87.7  PLT 399 333   Cardiac Enzymes: No results for input(s): CKTOTAL, CKMB, CKMBINDEX, TROPONINI in the last 168 hours. BNP: Invalid input(s): POCBNP CBG: Recent Labs  Lab 12/31/20 2227 01/01/21 0832  GLUCAP 88 102*   D-Dimer No results for input(s): DDIMER in the last 72 hours. Hgb A1c No results for input(s): HGBA1C in the last 72 hours. Lipid Profile No results for input(s): CHOL, HDL, LDLCALC, TRIG, CHOLHDL, LDLDIRECT in the last 72 hours. Thyroid function studies No results for input(s): TSH, T4TOTAL,  T3FREE, THYROIDAB in the last 72 hours.  Invalid input(s): FREET3 Anemia work up No results for input(s): VITAMINB12, FOLATE, FERRITIN, TIBC, IRON, RETICCTPCT in the last 72 hours. Urinalysis    Component Value Date/Time   COLORURINE Yellow 11/21/2014 1209   APPEARANCEUR Clear 11/21/2014 1209   LABSPEC 1.014 11/21/2014 1209   PHURINE 6.0 11/21/2014 1209   GLUCOSEU Negative 11/21/2014 1209   HGBUR Negative 11/21/2014 1209   BILIRUBINUR Negative 11/21/2014 1209   KETONESUR Negative 11/21/2014 1209   PROTEINUR Negative 11/21/2014 1209   NITRITE Negative 11/21/2014 1209   LEUKOCYTESUR Negative 11/21/2014 1209   Sepsis Labs Invalid input(s): PROCALCITONIN,  WBC,  LACTICIDVEN Microbiology Recent Results (from the past 240 hour(s))  Resp Panel by RT-PCR (Flu A&B, Covid) Nasopharyngeal Swab     Status: None   Collection Time: 12/31/20 10:49 PM   Specimen: Nasopharyngeal Swab; Nasopharyngeal(NP) swabs in vial transport medium  Result Value Ref Range Status   SARS Coronavirus 2 by RT PCR NEGATIVE NEGATIVE Final    Comment: (NOTE) SARS-CoV-2 target nucleic acids are NOT DETECTED.  The SARS-CoV-2 RNA is generally detectable in upper respiratory specimens during the acute phase of infection. The lowest concentration of SARS-CoV-2 viral copies this assay can detect is 138 copies/mL. A negative result does not preclude SARS-Cov-2 infection and should not be used as the sole basis for treatment or other patient management decisions. A negative result may occur with  improper specimen collection/handling, submission of specimen other than nasopharyngeal swab, presence of viral mutation(s) within the areas targeted by this assay, and inadequate number of viral copies(<138 copies/mL). A negative result must be combined with clinical observations, patient history, and epidemiological information. The expected result is Negative.  Fact Sheet for Patients:   BloggerCourse.comhttps://www.fda.gov/media/152166/download  Fact Sheet for Healthcare Providers:  SeriousBroker.ithttps://www.fda.gov/media/152162/download  This test is no t yet approved or cleared by the [place] FDA and  has been authorized for detection and/or diagnosis of SARS-CoV-2 by FDA under an Emergency Use Authorization (EUA). This EUA will remain  in effect (meaning this test can be used) for the duration of the COVID-19 declaration under Section 564(b)(1) of the Act, 21 U.S.C.section 360bbb-3(b)(1), unless the authorization is terminated  or revoked sooner.       Influenza A by PCR NEGATIVE NEGATIVE Final   Influenza B by PCR NEGATIVE NEGATIVE Final    Comment: (NOTE) The Xpert Xpress SARS-CoV-2/FLU/RSV plus assay is intended as an aid in the diagnosis of influenza from Nasopharyngeal swab specimens and should not be used as a sole basis for treatment.  Nasal washings and aspirates are unacceptable for Xpert Xpress SARS-CoV-2/FLU/RSV testing.  Fact Sheet for Patients: BloggerCourse.com  Fact Sheet for Healthcare Providers: SeriousBroker.it  This test is not yet approved or cleared by the [place] FDA and has been authorized for detection and/or diagnosis of SARS-CoV-2 by FDA under an Emergency Use Authorization (EUA). This EUA will remain in effect (meaning this test can be used) for the duration of the COVID-19 declaration under Section 564(b)(1) of the Act, 21 U.S.C. section 360bbb-3(b)(1), unless the authorization is terminated or revoked.  Performed at [facility], [address] [place], [place] [address]      Time coordinating discharge: 40 minutes  SIGNED:   Alba Cory, MD  [facility]

## 2021-01-02 NOTE — Tx Team (Signed)
Initial Treatment Plan 01/02/2021 4:12 PM Brent Stone [id number]    PATIENT STRESSORS: Loss of cousin d/t death in 2023/08/16 Other: crush on male who plays with pt's emotions   PATIENT STRENGTHS: Ability for insight Motivation for treatment/growth   PATIENT IDENTIFIED PROBLEMS: Ineffective coping skills  Lack of support system                   DISCHARGE CRITERIA:  Improved stabilization in mood, thinking, and/or behavior Motivation to continue treatment in a less acute level of care  PRELIMINARY DISCHARGE PLAN: Outpatient therapy Return to previous living arrangement Return to previous work or school arrangements  PATIENT/FAMILY INVOLVEMENT: This treatment plan has been presented to and reviewed with the patient, Brent Stone, and/or family member.  The patient and family have been given the opportunity to ask questions and make suggestions.  Sharin Mons, RN 01/02/2021, 4:12 PM

## 2021-01-02 NOTE — Progress Notes (Signed)
Pt is a 30 yr old male that lives with his younger sister and her two sons ages 72 yr and 2 yr old. Pt came to the ED after an OD on 24 500 mg tablets of Tylenol. Pt admits it was intentional and that he is remorseful and that, that is why he had his younger sister call 911. Pt shared with this Clinical research associate that he did this over a girl that he has known since he was 37 and has feelings for. Pt reports that she plays with his emotions. Pt reports frustration that comes a goes d/t his emotions for her that the girl does not always share. Pt shares that this Clinical research associate is the first person he has shared this detail with. 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 Pt denies SI/HI/AVH at this time. Skin assessment performed upon admission. Pt is calm and cooperative upon admission and feels safe while he is here.

## 2021-01-02 NOTE — Progress Notes (Signed)
Pt has been discharged from 1C and escorted by a sitter and security personal to [facility] unit.  AVS and Rx given and explained to pt.  Pt verbalized understanding.

## 2021-01-03 ENCOUNTER — Other Ambulatory Visit: Payer: Self-pay

## 2021-01-03 DIAGNOSIS — F4010 Social phobia, unspecified: Secondary | ICD-10-CM | POA: Diagnosis present

## 2021-01-03 DIAGNOSIS — F332 Major depressive disorder, recurrent severe without psychotic features: Principal | ICD-10-CM

## 2021-01-03 MED ORDER — ESCITALOPRAM OXALATE 10 MG PO TABS
10.0000 mg | ORAL_TABLET | Freq: Every day | ORAL | Status: DC
  Administered 2021-01-03: 10 mg via ORAL
  Filled 2021-01-03: qty 1

## 2021-01-03 MED ORDER — ESCITALOPRAM OXALATE 10 MG PO TABS
10.0000 mg | ORAL_TABLET | Freq: Every day | ORAL | 0 refills | Status: DC
  Filled 2021-01-03: qty 7, 7d supply, fill #0

## 2021-01-03 MED ORDER — NICOTINE 7 MG/24HR TD PT24
7.0000 mg | MEDICATED_PATCH | Freq: Once | TRANSDERMAL | Status: AC
  Administered 2021-01-03: 7 mg via TRANSDERMAL
  Filled 2021-01-03: qty 1

## 2021-01-03 NOTE — Progress Notes (Signed)
Patient calm and cooperative during assessment denying SI/HI/AVH. Patient observed interacting appropriately with staff and peers on the unit. Patient compliant with medication administration per MD orders. Patient being monitored Q 15 minutes for safety per unit protocol. Pt remains safe on the unit.  

## 2021-01-03 NOTE — H&P (Addendum)
Psychiatric Admission Assessment Adult  Patient Identification: Brent Stone MRN:  [id number] Date of Evaluation:  01/03/2021 Chief Complaint:  Major depressive disorder, recurrent severe without psychotic features (HCC) [F33.2] Principal Diagnosis: Major depressive disorder, recurrent severe without psychotic features (HCC) Diagnosis:  Principal Problem:   Major depressive disorder, recurrent severe without psychotic features (HCC) Active Problems:   Alcohol use   Acetaminophen overdose, intentional self-harm, initial encounter (HCC)   Social anxiety disorder  CC "I'd like to work on anxiety."  History of Present Illness: 30 year old male who presents after suicide attempt via acetaminophen overdose.  No acute events overnight, attending to ADLs.  Brent Stone was seen during treatment team this morning, and stated his goal was to improve his anxiety.  He was also seen one-on-one today, and is cooperative with the exam.  Patient notes that he was previously working at [employer] in [place] where he had several good friends.  He was contemplating moving back to [place] after a while for better living conditions.  He began speaking to and on and off again girlfriend of 12 years, and moved back to the area within 6 weeks.  He is now working at [employer].  He notes that he has always felt very strongly about this girl and hopes that they will be together long-term.  However, after moving back to town she stopped acting interested in him.  He notes this caused him to ruminate a lot about life, and he started to have some suicidal ideations.  He then took Tylenol and attempt to end his life.  After taking Tylenol he immediately regretted his decision, and told his sister  so he could get some help.  In retrospect, he feels like he wanted to just make this girl feel something towards him whether it was regret or love.  He denies current suicidal ideations, homicidal ideations, visual hallucinations,  auditory hallucinations.  Time spent discussing anxiety in general.  He notes he often has a lot more anxiety when he is around others.  He notes he often worries about the way he is perceived, or feeling like he is a burden on others.  He feels like he has some low self-esteem and self-image issues.  Denies any other generalized worries, panic attacks, or specific phobias.  Diagnosis of social anxiety discussed with patient, and he agrees that this diagnosis fits.  Discussed different forms of therapy that could be helpful, then patient agrees to go to [facility] at discharge.  Risks benefits and alternatives of SSRIs also discussed, and patient willing to start Lexapro tonight. Associated Signs/Symptoms: Depression Symptoms:  depressed mood, fatigue, feelings of worthlessness/guilt, hopelessness, suicidal attempt, Duration of Depression Symptoms: Greater than two weeks  (Hypo) Manic Symptoms:  Impulsivity, Anxiety Symptoms:  Social Anxiety, Psychotic Symptoms:  Denies PTSD Symptoms: Negative Total Time spent with patient: 1 hour  Past Psychiatric History: No previous psychiatric history.  No medication trials, prior hospital stays, or suicide attempts.  No history of violence.  Is the patient at risk to self? Yes.    Has the patient been a risk to self in the past 6 months? No.  Has the patient been a risk to self within the distant past? No.  Is the patient a risk to others? No.  Has the patient been a risk to others in the past 6 months? No.  Has the patient been a risk to others within the distant past? No.   Prior Inpatient Therapy:   Prior Outpatient Therapy:  Alcohol Screening: Patient refused Alcohol Screening Tool: Yes 1. How often do you have a drink containing alcohol?: 2 to 3 times a week 2. How many drinks containing alcohol do you have on a typical day when you are drinking?: 1 or 2 3. How often do you have six or more drinks on one occasion?: Never AUDIT-C Score: 3 4.  How often during the last year have you found that you were not able to stop drinking once you had started?: Never 5. How often during the last year have you failed to do what was normally expected from you because of drinking?: Never 6. How often during the last year have you needed a first drink in the morning to get yourself going after a heavy drinking session?: Never 7. How often during the last year have you had a feeling of guilt of remorse after drinking?: Never 8. How often during the last year have you been unable to remember what happened the night before because you had been drinking?: Never 9. Have you or someone else been injured as a result of your drinking?: No 10. Has a relative or friend or a doctor or another health worker been concerned about your drinking or suggested you cut down?: No Alcohol Use Disorder Identification Test Final Score (AUDIT): 3 Substance Abuse History in the last 12 months:  Yes.   Consequences of Substance Abuse: Worsening mental health  Previous Psychotropic Medications: Yes  Psychological Evaluations: No  Past Medical History:  Past Medical History:  Diagnosis Date  . Alcohol use   . Tobacco abuse    History reviewed. No pertinent surgical history. Family History: History reviewed. No pertinent family history. Family Psychiatric  History: Mother with anxiety Tobacco Screening: Have you used any form of tobacco in the last 30 days? (Cigarettes, Smokeless Tobacco, Cigars, and/or Pipes): Yes Tobacco use, Select all that apply: 5 or more cigarettes per day Are you interested in Tobacco Cessation Medications?: No, patient refused Counseled patient on smoking cessation including recognizing danger situations, developing coping skills and basic information about quitting provided: Refused/Declined practical counseling Social History:  Social History   Substance and Sexual Activity  Alcohol Use Yes  . Alcohol/week: 3.0 standard drinks  . Types: 3  Cans of beer per week   Comment: Three 24oz cans of beer per pt.      Social History   Substance and Sexual Activity  Drug Use Yes  . Types: Marijuana    Additional Social History:                           Allergies:  No Known Allergies Lab Results:  Results for orders placed or performed during the hospital encounter of 12/31/20 (from the past 48 hour(s))  HIV Antibody (routine testing w rflx)     Status: None   Collection Time: 01/02/21  6:25 AM  Result Value Ref Range   HIV Screen 4th Generation wRfx Non Reactive Non Reactive    Comment: Performed at [facility], [address], [place], [place] [address]  Comprehensive metabolic panel     Status: Abnormal   Collection Time: 01/02/21  6:25 AM  Result Value Ref Range   Sodium 139 135 - 145 mmol/L   Potassium 3.8 3.5 - 5.1 mmol/L   Chloride 107 98 - 111 mmol/L   CO2 24 22 - 32 mmol/L   Glucose, Bld 94 70 - 99 mg/dL  Comment: Glucose reference range applies only to samples taken after fasting for at least 8 hours.   BUN 7 6 - 20 mg/dL   Creatinine, Ser 1.24 (L) 0.61 - 1.24 mg/dL   Calcium 8.4 (L) 8.9 - 10.3 mg/dL   Total Protein 6.6 6.5 - 8.1 g/dL   Albumin 3.2 (L) 3.5 - 5.0 g/dL   AST 25 15 - 41 U/L   ALT 31 0 - 44 U/L   Alkaline Phosphatase 46 38 - 126 U/L   Total Bilirubin 0.7 0.3 - 1.2 mg/dL   GFR, Estimated >58 >09 mL/min    Comment: (NOTE) Calculated using the CKD-EPI Creatinine Equation (2021)    Anion gap 8 5 - 15    Comment: Performed at [facility], [address] [place], [place] [address]  CBC     Status: None   Collection Time: 01/02/21  6:25 AM  Result Value Ref Range   WBC 7.6 4.0 - 10.5 K/uL   RBC 4.78 4.22 - 5.81 MIL/uL   Hemoglobin 14.9 13.0 - 17.0 g/dL   HCT 25.0 53.9 - 76.7 %   MCV 87.7 80.0 - 100.0 fL   MCH 31.2 26.0 - 34.0 pg   MCHC 35.6 30.0 - 36.0 g/dL   RDW 34.1 93.7 - 90.2 %   Platelets 333 150 - 400 K/uL   nRBC 0.0 0.0 - 0.2 %    Comment: Performed at  [facility], [address], [place], [place] [address]  Acetaminophen level     Status: Abnormal   Collection Time: 01/02/21  6:25 AM  Result Value Ref Range   Acetaminophen (Tylenol), Serum <10 (L) 10 - 30 ug/mL    Comment: (NOTE) Therapeutic concentrations vary significantly. A range of 10-30 ug/mL  may be an effective concentration for many patients. However, some  are best treated at concentrations outside of this range. Acetaminophen concentrations >150 ug/mL at 4 hours after ingestion  and >50 ug/mL at 12 hours after ingestion are often associated with  toxic reactions.  Performed at [facility], [address] [place], [place] [address]     Blood Alcohol level:  Lab Results  Component Value Date   ETH 30 (H) 12/31/2020    Metabolic Disorder Labs:  No results found for: HGBA1C, MPG No results found for: PROLACTIN No results found for: CHOL, TRIG, HDL, CHOLHDL, VLDL, LDLCALC  Current Medications: Current Facility-Administered Medications  Medication Dose Route Frequency Provider Last Rate Last Admin  . acetaminophen (TYLENOL) tablet 650 mg  650 mg Oral Q6H PRN Charm Rings, NP      . alum & mag hydroxide-simeth (MAALOX/MYLANTA) 200-200-20 MG/5ML suspension 30 mL  30 mL Oral Q4H PRN Charm Rings, NP      . escitalopram (LEXAPRO) tablet 10 mg  10 mg Oral QHS Jesse Sans, MD      . folic acid (FOLVITE) tablet 1 mg  1 mg Oral Daily Charm Rings, NP   1 mg at 01/03/21 2426  . ibuprofen (ADVIL) tablet 200 mg  200 mg Oral Q6H PRN Charm Rings, NP      . magnesium hydroxide (MILK OF MAGNESIA) suspension 30 mL  30 mL Oral Daily PRN Charm Rings, NP      . multivitamin with minerals tablet 1 tablet  1 tablet Oral Daily Charm Rings, NP   1 tablet at 01/03/21 [phone]  . nicotine (NICODERM CQ - dosed in mg/24 hours) patch 21 mg  21 mg Transdermal Daily Charm Rings, NP   21 mg at 01/03/21 8921  . nicotine (NICODERM CQ - dosed in mg/24  hr) patch 7 mg  7 mg Transdermal Once Jesse Sans, MD   7 mg at 01/03/21 1447  . ondansetron (ZOFRAN) injection 4 mg  4 mg Intravenous Q8H PRN Charm Rings, NP      . thiamine tablet 100 mg  100 mg Oral Daily Charm Rings, NP   100 mg at 01/03/21 1941   Or  . thiamine (B-1) injection 100 mg  100 mg Intravenous Daily Charm Rings, NP       PTA Medications: Medications Prior to Admission  Medication Sig Dispense Refill Last Dose  . folic acid (FOLVITE) 1 MG tablet Take 1 tablet (1 mg total) by mouth daily. 30 tablet 0   . ibuprofen (ADVIL) 200 MG tablet Take 1 tablet (200 mg total) by mouth every 6 (six) hours as needed for headache or fever. 30 tablet 0   . Multiple Vitamin (MULTIVITAMIN WITH MINERALS) TABS tablet Take 1 tablet by mouth daily. 30 tablet 0   . nicotine (NICODERM CQ - DOSED IN MG/24 HOURS) 21 mg/24hr patch Place 1 patch (21 mg total) onto the skin daily. 28 patch 0   . thiamine 100 MG tablet Take 1 tablet (100 mg total) by mouth daily. 30 tablet 0     Musculoskeletal: Strength & Muscle Tone: within normal limits Gait & Station: normal Patient leans: N/A            Psychiatric Specialty Exam:  Presentation  General Appearance: Casual  Eye Contact:Good  Speech:Normal Rate  Speech Volume:Normal  Handedness:Right   Mood and Affect  Mood:Anxious  Affect:Congruent   Thought Process  Thought Processes:Coherent; Goal Directed  Duration of Psychotic Symptoms: No data recorded Past Diagnosis of Schizophrenia or Psychoactive disorder: No  Descriptions of Associations:Intact  Orientation:Full (Time, Place and Person)  Thought Content:Logical  Hallucinations:Hallucinations: None  Ideas of Reference:None  Suicidal Thoughts:Suicidal Thoughts: No  Homicidal Thoughts:Homicidal Thoughts: No   Sensorium  Memory:Immediate Good; Recent Good; Remote Good  Judgment:Intact  Insight:Present   Executive Functions   Concentration:Good  Attention Span:Good  Recall:Good  Fund of Knowledge:Good  Language:Good   Psychomotor Activity  Psychomotor Activity:Psychomotor Activity: Normal   Assets  Assets:Communication Skills; Desire for Improvement; Housing; Physical Health; Resilience; Social Support; Talents/Skills; Transportation; Vocational/Educational   Sleep  Sleep:Sleep: Fair Number of Hours of Sleep: 7.45    Physical Exam: Physical Exam Vitals and nursing note reviewed.  Constitutional:      Appearance: Normal appearance.  HENT:     Head: Normocephalic and atraumatic.     Right Ear: External ear normal.     Left Ear: External ear normal.     Nose: Nose normal.     Mouth/Throat:     Mouth: Mucous membranes are moist.     Pharynx: Oropharynx is clear.  Eyes:     Extraocular Movements: Extraocular movements intact.     Conjunctiva/sclera: Conjunctivae normal.     Pupils: Pupils are equal, round, and reactive to light.  Cardiovascular:     Rate and Rhythm: Normal rate.     Pulses: Normal pulses.  Pulmonary:     Effort: Pulmonary effort is normal.     Breath sounds: Normal breath sounds.  Abdominal:     General: Abdomen is flat.     Palpations: Abdomen is soft.  Musculoskeletal:  General: No swelling. Normal range of motion.     Cervical back: Normal range of motion and neck supple.  Skin:    General: Skin is warm and dry.  Neurological:     General: No focal deficit present.     Mental Status: He is alert and oriented to person, place, and time.  Psychiatric:        Attention and Perception: Attention and perception normal.        Mood and Affect: Affect normal. Mood is anxious.        Speech: Speech normal.        Behavior: Behavior is cooperative.        Thought Content: Thought content does not include homicidal or suicidal ideation.        Cognition and Memory: Cognition and memory normal.        Judgment: Judgment is impulsive.    Review of Systems   Constitutional: Negative.   HENT: Negative.   Eyes: Negative.   Respiratory: Negative.   Cardiovascular: Negative.   Gastrointestinal: Negative.   Genitourinary: Negative.   Musculoskeletal: Negative.   Skin: Negative.   Neurological: Negative.   Endo/Heme/Allergies: Negative.   Psychiatric/Behavioral: Positive for depression. Negative for hallucinations and suicidal ideas. The patient is nervous/anxious. The patient does not have insomnia.    Blood pressure 118/83, pulse 73, temperature 98.1 F (36.7 C), temperature source Oral, resp. rate 17, height 5\' 6"  (1.676 m), weight 75.3 kg, SpO2 100 %. Body mass index is 26.79 kg/m.  Treatment Plan Summary: Daily contact with patient to assess and evaluate symptoms and progress in treatment and Medication management 30 year old male presenting after suicide attempt via acetaminophen overdose.  Medically cleared and admitted to our unit.  Diagnosis consistent with major depressive disorder, recurrent, severe, without psychotic features and social anxiety disorder.  Start Lexapro 10 mg nightly.  Observation Level/Precautions:  15 minute checks  Laboratory:  Completed in ED  Psychotherapy:    Medications:    Consultations:    Discharge Concerns:    Estimated LOS:  Other:     Physician Treatment Plan for Primary Diagnosis: Major depressive disorder, recurrent severe without psychotic features (HCC) Long Term Goal(s): Improvement in symptoms so as ready for discharge  Short Term Goals: Ability to identify changes in lifestyle to reduce recurrence of condition will improve, Ability to verbalize feelings will improve, Ability to disclose and discuss suicidal ideas, Ability to demonstrate self-control will improve, Ability to identify and develop effective coping behaviors will improve, Ability to maintain clinical measurements within normal limits will improve, Compliance with prescribed medications will improve and Ability to identify triggers  associated with substance abuse/mental health issues will improve  Physician Treatment Plan for Secondary Diagnosis: Principal Problem:   Major depressive disorder, recurrent severe without psychotic features (HCC) Active Problems:   Alcohol use   Acetaminophen overdose, intentional self-harm, initial encounter (HCC)   Social anxiety disorder  Long Term Goal(s): Improvement in symptoms so as ready for discharge  Short Term Goals: Ability to identify changes in lifestyle to reduce recurrence of condition will improve, Ability to verbalize feelings will improve, Ability to disclose and discuss suicidal ideas, Ability to demonstrate self-control will improve, Ability to identify and develop effective coping behaviors will improve, Ability to maintain clinical measurements within normal limits will improve, Compliance with prescribed medications will improve and Ability to identify triggers associated with substance abuse/mental health issues will improve  I certify that inpatient services furnished can reasonably be expected to  improve the patient's condition.    Jesse Sans, MD 5/23/20222:50 PM
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
## 2021-01-03 NOTE — Plan of Care (Signed)
  Problem: Education: Goal: Ability to state activities that reduce stress will improve Outcome: Progressing   Problem: Coping: Goal: Ability to identify and develop effective coping behavior will improve Outcome: Progressing   Problem: Self-Concept: Goal: Ability to identify factors that promote anxiety will improve Outcome: Progressing Goal: Level of anxiety will decrease Outcome: Progressing Goal: Ability to modify response to factors that promote anxiety will improve Outcome: Progressing   Problem: Education: Goal: Utilization of techniques to improve thought processes will improve Outcome: Progressing Goal: Knowledge of the prescribed therapeutic regimen will improve Outcome: Progressing   Problem: Activity: Goal: Interest or engagement in leisure activities will improve Outcome: Progressing Goal: Imbalance in normal sleep/wake cycle will improve Outcome: Progressing   Problem: Coping: Goal: Coping ability will improve Outcome: Progressing Goal: Will verbalize feelings Outcome: Progressing   Problem: Health Behavior/Discharge Planning: Goal: Ability to make decisions will improve Outcome: Progressing Goal: Compliance with therapeutic regimen will improve Outcome: Progressing   Problem: Role Relationship: Goal: Will demonstrate positive changes in social behaviors and relationships Outcome: Progressing   Problem: Safety: Goal: Ability to disclose and discuss suicidal ideas will improve Outcome: Progressing Goal: Ability to identify and utilize support systems that promote safety will improve Outcome: Progressing   Problem: Self-Concept: Goal: Will verbalize positive feelings about self Outcome: Progressing Goal: Level of anxiety will decrease Outcome: Progressing   Problem: Education: Goal: Knowledge of [facility] Education information/materials will improve Outcome: Progressing Goal: Emotional status will improve Outcome: Progressing Goal:  Mental status will improve Outcome: Progressing Goal: Verbalization of understanding the information provided will improve Outcome: Progressing   Problem: Activity: Goal: Interest or engagement in activities will improve Outcome: Progressing Goal: Sleeping patterns will improve Outcome: Progressing   Problem: Coping: Goal: Ability to verbalize frustrations and anger appropriately will improve Outcome: Progressing Goal: Ability to demonstrate self-control will improve Outcome: Progressing   Problem: Health Behavior/Discharge Planning: Goal: Identification of resources available to assist in meeting health care needs will improve Outcome: Progressing Goal: Compliance with treatment plan for underlying cause of condition will improve Outcome: Progressing   Problem: Physical Regulation: Goal: Ability to maintain clinical measurements within normal limits will improve Outcome: Progressing   Problem: Safety: Goal: Periods of time without injury will increase Outcome: Progressing

## 2021-01-03 NOTE — Progress Notes (Signed)
Patient has been calm and cooperative. Denies SI, HI and AVH 

## 2021-01-03 NOTE — Plan of Care (Signed)
Patient denies anxiety with this writer  Problem: Self-Concept: Goal: Level of anxiety will decrease Outcome: Progressing   

## 2021-01-03 NOTE — Progress Notes (Signed)
Pt denies depression, anxiety, SI, HI and AVH. Pt says "I feel great " Pt is brighter than he was this morning. Torrie Mayers RN

## 2021-01-03 NOTE — [facility] Group Notes (Signed)
LCSW Group Therapy Note   01/03/2021 2:37 PM  Type of Therapy and Topic:  Group Therapy:  Overcoming Obstacles   Participation Level:  Active   Description of Group:    In this group patients will be encouraged to explore what they see as obstacles to their own wellness and recovery. They will be guided to discuss their thoughts, feelings, and behaviors related to these obstacles. The group will process together ways to cope with barriers, with attention given to specific choices patients can make. Each patient will be challenged to identify changes they are motivated to make in order to overcome their obstacles. This group will be process-oriented, with patients participating in exploration of their own experiences as well as giving and receiving support and challenge from other group members.   Therapeutic Goals: 1. Patient will identify personal and current obstacles as they relate to admission. 2. Patient will identify barriers that currently interfere with their wellness or overcoming obstacles.  3. Patient will identify feelings, thought process and behaviors related to these barriers. 4. Patient will identify two changes they are willing to make to overcome these obstacles:      Summary of Patient Progress Patient was present for the entirety of the group. He spoke about his anxiety and was able to identify with his peers around feelings of loneliness, wanting to feel accepted/not be rejected, and the unbearable weight of others expectations. Patient's comments were pertinent to the discussion and his feedback was appropriate.     Therapeutic Modalities:   Cognitive Behavioral Therapy Solution Focused Therapy Motivational Interviewing Relapse Prevention Therapy  Simona Huh R. Algis Greenhouse, MSW, LCSW, LCAS 01/03/2021 2:37 PM

## 2021-01-03 NOTE — Tx Team (Signed)
Interdisciplinary Treatment and Diagnostic Plan Update  01/03/2021 Time of Session: 9:00AM Brent Stone MRN: [id number]  Principal Diagnosis: <principal problem not specified>  Secondary Diagnoses: Active Problems:   Major depressive disorder, recurrent severe without psychotic features ([place])   Current Medications:  Current Facility-Administered Medications  Medication Dose Route Frequency Provider Last Rate Last Admin  . acetaminophen (TYLENOL) tablet 650 mg  650 mg Oral Q6H PRN Patrecia Pour, NP      . alum & mag hydroxide-simeth (MAALOX/MYLANTA) 200-200-20 MG/5ML suspension 30 mL  30 mL Oral Q4H PRN Patrecia Pour, NP      . folic acid (FOLVITE) tablet 1 mg  1 mg Oral Daily Patrecia Pour, NP   1 mg at 01/03/21 4967  . ibuprofen (ADVIL) tablet 200 mg  200 mg Oral Q6H PRN Patrecia Pour, NP      . LORazepam (ATIVAN) injection 0-4 mg  0-4 mg Intravenous Q6H Patrecia Pour, NP       Followed by  . LORazepam (ATIVAN) injection 0-4 mg  0-4 mg Intravenous Q12H Lord, Asa Saunas, NP      . LORazepam (ATIVAN) tablet 1-4 mg  1-4 mg Oral Q1H PRN Patrecia Pour, NP       Or  . LORazepam (ATIVAN) injection 1-4 mg  1-4 mg Intravenous Q1H PRN Patrecia Pour, NP      . magnesium hydroxide (MILK OF MAGNESIA) suspension 30 mL  30 mL Oral Daily PRN Patrecia Pour, NP      . multivitamin with minerals tablet 1 tablet  1 tablet Oral Daily Patrecia Pour, NP   1 tablet at 01/03/21 [phone]  . nicotine (NICODERM CQ - dosed in mg/24 hours) patch 21 mg  21 mg Transdermal Daily Patrecia Pour, NP   21 mg at 01/03/21 0806  . ondansetron (ZOFRAN) injection 4 mg  4 mg Intravenous Q8H PRN Patrecia Pour, NP      . thiamine tablet 100 mg  100 mg Oral Daily Patrecia Pour, NP   100 mg at 01/03/21 3846   Or  . thiamine (B-1) injection 100 mg  100 mg Intravenous Daily Patrecia Pour, NP       PTA Medications: Medications Prior to Admission  Medication Sig Dispense Refill Last Dose  . folic acid  (FOLVITE) 1 MG tablet Take 1 tablet (1 mg total) by mouth daily. 30 tablet 0   . ibuprofen (ADVIL) 200 MG tablet Take 1 tablet (200 mg total) by mouth every 6 (six) hours as needed for headache or fever. 30 tablet 0   . Multiple Vitamin (MULTIVITAMIN WITH MINERALS) TABS tablet Take 1 tablet by mouth daily. 30 tablet 0   . nicotine (NICODERM CQ - DOSED IN MG/24 HOURS) 21 mg/24hr patch Place 1 patch (21 mg total) onto the skin daily. 28 patch 0   . thiamine 100 MG tablet Take 1 tablet (100 mg total) by mouth daily. 30 tablet 0     Patient Stressors: Loss of cousin d/t death in 09/07/2023 Other: crush on male who plays with pt's emotions  Patient Strengths: Ability for insight Motivation for treatment/growth  Treatment Modalities: Medication Management, Group therapy, Case management,  1 to 1 session with clinician, Psychoeducation, Recreational therapy.   Physician Treatment Plan for Primary Diagnosis: <principal problem not specified> Long Term Goal(s):     Short Term Goals:    Medication Management: Evaluate patient's response, side effects, and tolerance of medication regimen.  Therapeutic Interventions: 1 to 1 sessions, Unit Group sessions and Medication administration.  Evaluation of Outcomes: Not Met  Physician Treatment Plan for Secondary Diagnosis: Active Problems:   Major depressive disorder, recurrent severe without psychotic features ([place])  Long Term Goal(s):     Short Term Goals:       Medication Management: Evaluate patient's response, side effects, and tolerance of medication regimen.  Therapeutic Interventions: 1 to 1 sessions, Unit Group sessions and Medication administration.  Evaluation of Outcomes: Not Met   RN Treatment Plan for Primary Diagnosis: <principal problem not specified> Long Term Goal(s): Knowledge of disease and therapeutic regimen to maintain health will improve  Short Term Goals: Ability to remain free from injury will improve, Ability to  demonstrate self-control, Ability to participate in decision making will improve, Ability to verbalize feelings will improve, Ability to identify and develop effective coping behaviors will improve and Compliance with prescribed medications will improve  Medication Management: RN will administer medications as ordered by provider, will assess and evaluate patient's response and provide education to patient for prescribed medication. RN will report any adverse and/or side effects to prescribing provider.  Therapeutic Interventions: 1 on 1 counseling sessions, Psychoeducation, Medication administration, Evaluate responses to treatment, Monitor vital signs and CBGs as ordered, Perform/monitor CIWA, COWS, AIMS and Fall Risk screenings as ordered, Perform wound care treatments as ordered.  Evaluation of Outcomes: Not Met   LCSW Treatment Plan for Primary Diagnosis: <principal problem not specified> Long Term Goal(s): Safe transition to appropriate next level of care at discharge, Engage patient in therapeutic group addressing interpersonal concerns.  Short Term Goals: Engage patient in aftercare planning with referrals and resources, Increase social support, Increase ability to appropriately verbalize feelings, Increase emotional regulation, Identify triggers associated with mental health/substance abuse issues and Increase skills for wellness and recovery  Therapeutic Interventions: Assess for all discharge needs, 1 to 1 time with Social worker, Explore available resources and support systems, Assess for adequacy in community support network, Educate family and significant other(s) on suicide prevention, Complete Psychosocial Assessment, Interpersonal group therapy.  Evaluation of Outcomes: Not Met   Progress in Treatment: Attending groups: No. Participating in groups: No. Taking medication as prescribed: Yes. Toleration medication: Yes. Family/Significant other contact made: No, will contact:   when given permission.  Patient understands diagnosis: Yes. Discussing patient identified problems/goals with staff: Yes. Medical problems stabilized or resolved: Yes. Denies suicidal/homicidal ideation: Yes. Issues/concerns per patient self-inventory: No. Other: None.  New problem(s) identified: No, Describe:  none.  New Short Term/Long Term Goal(s): medication management for mood stabilization; elimination of SI thoughts; development of comprehensive mental wellness plan.  Patient Goals: "Probably trying to figure out how not to be anxious all the time."   Discharge Plan or Barriers: CSW will assist pt with development of an appropriate aftercare/discharge plan.  Reason for Continuation of Hospitalization: Anxiety Medication stabilization  Estimated Length of Stay: 1-7 days   Attendees: Patient: Brent Stone 01/03/2021 10:04 AM  Physician: Selina Cooley, MD 01/03/2021 10:04 AM  Nursing: Collier Bullock, RN 01/03/2021 10:04 AM  RN Care Manager: 01/03/2021 10:04 AM  Social Worker: Chalmers Guest. Guerry Bruin, MSW, [place], [place] 01/03/2021 10:04 AM  Recreational Therapist: Devin Going, LRT  01/03/2021 10:04 AM  Other: Kiva Martinique, MSW, LCSW-A 01/03/2021 10:04 AM  Other: Assunta Curtis, MSW, LCSW 01/03/2021 10:04 AM  Other: 01/03/2021 10:04 AM    Scribe for Treatment Team: Shirl Harris, LCSW 01/03/2021 10:04 AM

## 2021-01-03 NOTE — Plan of Care (Signed)
Pt denies depression, anxiety, SI, HI and AVH. Pt was educated on care plan and verbalizes understanding. Torrie Mayers RN Problem: Education: Goal: Ability to state activities that reduce stress will improve Outcome: Progressing   Problem: Coping: Goal: Ability to identify and develop effective coping behavior will improve Outcome: Progressing   Problem: Self-Concept: Goal: Ability to identify factors that promote anxiety will improve Outcome: Progressing Goal: Level of anxiety will decrease Outcome: Progressing Goal: Ability to modify response to factors that promote anxiety will improve Outcome: Progressing   Problem: Education: Goal: Utilization of techniques to improve thought processes will improve Outcome: Progressing Goal: Knowledge of the prescribed therapeutic regimen will improve Outcome: Progressing   Problem: Activity: Goal: Interest or engagement in leisure activities will improve Outcome: Progressing Goal: Imbalance in normal sleep/wake cycle will improve Outcome: Progressing   Problem: Coping: Goal: Coping ability will improve Outcome: Progressing Goal: Will verbalize feelings Outcome: Progressing   Problem: Health Behavior/Discharge Planning: Goal: Ability to make decisions will improve Outcome: Progressing Goal: Compliance with therapeutic regimen will improve Outcome: Progressing   Problem: Role Relationship: Goal: Will demonstrate positive changes in social behaviors and relationships Outcome: Progressing   Problem: Safety: Goal: Ability to disclose and discuss suicidal ideas will improve Outcome: Progressing Goal: Ability to identify and utilize support systems that promote safety will improve Outcome: Progressing   Problem: Self-Concept: Goal: Will verbalize positive feelings about self Outcome: Progressing Goal: Level of anxiety will decrease Outcome: Progressing   Problem: Education: Goal: Knowledge of  General Education  information/materials will improve Outcome: Progressing Goal: Emotional status will improve Outcome: Progressing Goal: Mental status will improve Outcome: Progressing Goal: Verbalization of understanding the information provided will improve Outcome: Progressing   Problem: Activity: Goal: Interest or engagement in activities will improve Outcome: Progressing Goal: Sleeping patterns will improve Outcome: Progressing   Problem: Coping: Goal: Ability to verbalize frustrations and anger appropriately will improve Outcome: Progressing Goal: Ability to demonstrate self-control will improve Outcome: Progressing   Problem: Health Behavior/Discharge Planning: Goal: Identification of resources available to assist in meeting health care needs will improve Outcome: Progressing Goal: Compliance with treatment plan for underlying cause of condition will improve Outcome: Progressing   Problem: Physical Regulation: Goal: Ability to maintain clinical measurements within normal limits will improve Outcome: Progressing   Problem: Safety: Goal: Periods of time without injury will increase Outcome: Progressing

## 2021-01-03 NOTE — Progress Notes (Signed)
Recreation Therapy Notes  Date: 01/03/2021  Time: 9:30 am   Location: Craft room   Behavioral response: Appropriate   Intervention Topic: Goals   Discussion/Intervention:  Group content on today was focused on goals. Patients described what goals are and how they define goals. Individuals expressed how they go about setting goals and reaching them. The group identified how important goals are and if they make short term goals to reach long term goals. Patients described how many goals they work on at a time and what affects them not reaching their goal. Individuals described how much time they put into planning and obtaining their goals. The group participated in the intervention "My Goal Board" and made personal goal boards to help them achieve their goal. Clinical Observations/Feedback: Patient came to group and identified his goal as saving up money and getting a car and a place. He explained that goals come from personal beliefs and morals. Individual was social with peers and staff while participating in intervention.  Sanyla Summey LRT/CTRS         Shanese Riemenschneider 01/03/2021 12:26 PM

## 2021-01-03 NOTE — [facility] Suicide Risk Assessment (Signed)
[facility] Admission Suicide Risk Assessment   Nursing information obtained from:  Patient Demographic factors:  Male,Caucasian,Low socioeconomic status Current Mental Status:  NA Loss Factors:  Loss of significant relationship Historical Factors:  NA Risk Reduction Factors:  Employed,Living with another person, especially a relative  Total Time spent with patient: 1 hour Principal Problem: Major depressive disorder, recurrent severe without psychotic features (H[facility]) Diagnosis:  Principal Problem:   Major depressive disorder, recurrent severe without psychotic features ([facility]) Active Problems:   Alcohol use   Acetaminophen overdose, intentional self-harm, initial encounter ([facility])   Social anxiety disorder  Subjective Data: 30 year old male who presents after suicide attempt via acetaminophen overdose.  No acute events overnight, attending to ADLs.  Brent Stone was seen during treatment team this morning, and stated his goal was to improve his anxiety.  He was also seen one-on-one today, and is cooperative with the exam.  Patient notes that he was previously working at [employer] in [place] where he had several good friends.  He was contemplating moving back to [place] after a while for better living conditions.  He began speaking to and on and off again girlfriend of 12 years, and moved back to the area within 6 weeks.  He is now working at [employer].  He notes that he has always felt very strongly about this girl and hopes that they will be together long-term.  However, after moving back to town she stopped acting interested in him.  He notes this caused him to ruminate a lot about life, and he started to have some suicidal ideations.  He then took Tylenol and attempt to end his life.  After taking Tylenol he immediately regretted his decision, and told his sister  so he could get some help.  In retrospect, he feels like he wanted to just make this girl feel something towards him whether it was regret or  love.  He denies current suicidal ideations, homicidal ideations, visual hallucinations, auditory hallucinations.  Time spent discussing anxiety in general.  He notes he often has a lot more anxiety when he is around others.  He notes he often worries about the way he is perceived, or feeling like he is a burden on others.  He feels like he has some low self-esteem and self-image issues.  Denies any other generalized worries, panic attacks, or specific phobias.  Diagnosis of social anxiety discussed with patient, and he agrees that this diagnosis fits.  Discussed different forms of therapy that could be helpful, then patient agrees to go to [facility] at discharge.  Risks benefits and alternatives of SSRIs also discussed, and patient willing to start Lexapro tonight.  Continued Clinical Symptoms:  Alcohol Use Disorder Identification Test Final Score (AUDIT): 3 The "Alcohol Use Disorders Identification Test", Guidelines for Use in Primary Care, Second Edition.  World Science writer [facility]). Score between 0-7:  no or low risk or alcohol related problems. Score between 8-15:  moderate risk of alcohol related problems. Score between 16-19:  high risk of alcohol related problems. Score 20 or above:  warrants further diagnostic evaluation for alcohol dependence and treatment.   CLINICAL FACTORS:   Severe Anxiety and/or Agitation Depression:   Anhedonia Hopelessness Impulsivity Alcohol/Substance Abuse/Dependencies Unstable or Poor Therapeutic Relationship   Musculoskeletal: Strength & Muscle Tone: within normal limits Gait & Station: normal Patient leans: N/A  Psychiatric Specialty Exam:  Presentation  General Appearance: Casual  Eye Contact:Good  Speech:Normal Rate  Speech Volume:Normal  Handedness:Right   Mood and Affect  Mood:Anxious  Affect:Congruent   Thought Process  Thought Processes:Coherent; Goal Directed  Descriptions of Associations:Intact  Orientation:Full (Time,  Place and Person)  Thought Content:Logical  History of Schizophrenia/Schizoaffective disorder:No  Duration of Psychotic Symptoms:No data recorded Hallucinations:Hallucinations: None  Ideas of Reference:None  Suicidal Thoughts:Suicidal Thoughts: No  Homicidal Thoughts:Homicidal Thoughts: No   Sensorium  Memory:Immediate Good; Recent Good; Remote Good  Judgment:Intact  Insight:Present   Executive Functions  Concentration:Good  Attention Span:Good  Recall:Good  Fund of Knowledge:Good  Language:Good   Psychomotor Activity  Psychomotor Activity:Psychomotor Activity: Normal   Assets  Assets:Communication Skills; Desire for Improvement; Housing; Physical Health; Resilience; Social Support; Talents/Skills; Transportation; Vocational/Educational   Sleep  Sleep:Sleep: Fair Number of Hours of Sleep: 7.45    Physical Exam: Physical Exam ROS Blood pressure 118/83, pulse 73, temperature 98.1 F (36.7 C), temperature source Oral, resp. rate 17, height 5\' 6"  (1.676 m), weight 75.3 kg, SpO2 100 %. Body mass index is 26.79 kg/m.   COGNITIVE FEATURES THAT CONTRIBUTE TO RISK:  None    SUICIDE RISK:   Mild:  Suicidal ideation of limited frequency, intensity, duration, and specificity.  There are no identifiable plans, no associated intent, mild dysphoria and related symptoms, good self-control (both objective and subjective assessment), few other risk factors, and identifiable protective factors, including available and accessible social support.  PLAN OF CARE: Continue inpatient admission, see H&P for details.   I certify that inpatient services furnished can reasonably be expected to improve the patient's condition.   , MD 01/03/2021, 3:02 PM

## 2021-01-03 NOTE — [facility] Group Notes (Signed)
[facility] Group Notes:  (Nursing/MHT/Case Management/Adjunct)  Date:  01/03/2021  Time:  9:48 PM  Type of Therapy:  Group Therapy  Participation Level:  Active  Participation Quality:  Appropriate  Affect:  Appropriate  Cognitive:  Alert  Insight:  Good  Engagement in Group:  Engaged and goal is to bring down anxiety.  Modes of Intervention:  Support  Summary of Progress/Problems:  Brent Stone 01/03/2021, 9:48 PM

## 2021-01-03 NOTE — [facility] Suicide Risk Assessment (Signed)
[facility] INPATIENT:  Family/Significant Other Suicide Prevention Education  Suicide Prevention Education: SPE completed with pt, as pt refused to consent to family contact. SPI pamphlet provided to pt and pt was encouraged to share information with support network, ask questions, and talk about any concerns relating to SPE. Pt denies access to guns/firearms and verbalized understanding of information provided. Mobile Crisis information also provided to pt.  Patient Refusal for Family/Significant Other Suicide Prevention Education: The patient Brent Stone has refused to provide written consent for family/significant other to be provided Family/Significant Other Suicide Prevention Education during admission and/or prior to discharge.  Physician notified.  Keller Mikels A [place] 01/03/2021, 3:59 PM

## 2021-01-04 MED ORDER — ESCITALOPRAM OXALATE 10 MG PO TABS: 10.0000 mg | ORAL_TABLET | Freq: Every day | ORAL | 1 refills | Status: AC

## 2021-01-04 NOTE — [facility] Suicide Risk Assessment (Signed)
[facility] Discharge Suicide Risk Assessment   Principal Problem: Major depressive disorder, recurrent severe without psychotic features (HCC) Discharge Diagnoses: Principal Problem:   Major depressive disorder, recurrent severe without psychotic features (HCC) Active Problems:   Alcohol use   Acetaminophen overdose, intentional self-harm, initial encounter (HCC)   Social anxiety disorder   Total Time spent with patient: 35 minutes- 25 minutes face-to-face contact with patient, 10 minutes documentation, coordination of care, scripts   Musculoskeletal: Strength & Muscle Tone: within normal limits Gait & Station: normal Patient leans: N/A  Psychiatric Specialty Exam  Presentation  General Appearance: Casual; Appropriate for Environment  Eye Contact:Good  Speech:Normal Rate  Speech Volume:Normal  Handedness:Right   Mood and Affect  Mood:Euthymic  Duration of Depression Symptoms: Greater than two weeks  Affect:Congruent   Thought Process  Thought Processes:Coherent; Goal Directed  Descriptions of Associations:Intact  Orientation:Full (Time, Place and Person)  Thought Content:Logical  History of Schizophrenia/Schizoaffective disorder:No  Duration of Psychotic Symptoms:No data recorded Hallucinations:Hallucinations: None  Ideas of Reference:None  Suicidal Thoughts:Suicidal Thoughts: No  Homicidal Thoughts:Homicidal Thoughts: No   Sensorium  Memory:Immediate Good; Recent Good; Remote Good  Judgment:Intact  Insight:Present   Executive Functions  Concentration:Good  Attention Span:Good  Recall:Good  Fund of Knowledge:Good  Language:Good   Psychomotor Activity  Psychomotor Activity:Psychomotor Activity: Normal   Assets  Assets:Communication Skills; Desire for Improvement; Housing; Physical Health; Resilience; Social Support; Talents/Skills; Transportation; Vocational/Educational   Sleep  Sleep:Sleep: Good Number of Hours of Sleep:  8   Physical Exam: Physical Exam ROS Blood pressure (!) 134/91, pulse 73, temperature 97.9 F (36.6 C), temperature source Oral, resp. rate 18, height 5\' 6"  (1.676 m), weight 75.3 kg, SpO2 98 %. Body mass index is 26.79 kg/m.  Mental Status Per Nursing Assessment::   On Admission:  NA  Demographic Factors:  Male and Caucasian  Loss Factors: NA  Historical Factors: Prior suicide attempts  Risk Reduction Factors:   Sense of responsibility to family, Employed, Living with another person, especially a relative, Positive social support, Positive therapeutic relationship and Positive coping skills or problem solving skills  Continued Clinical Symptoms:  Severe Anxiety and/or Agitation Depression:   Recent sense of peace/wellbeing  Cognitive Features That Contribute To Risk:  None    Suicide Risk:  Mild:  Suicidal ideation of limited frequency, intensity, duration, and specificity.  There are no identifiable plans, no associated intent, mild dysphoria and related symptoms, good self-control (both objective and subjective assessment), few other risk factors, and identifiable protective factors, including available and accessible social support.   Follow-up Information    [facility] Follow up on 01/07/2021.   Why: 01/09/2021 with peer support services will pick you up at 7:00AM on 01/07/21. Thanks! Contact information: [address] [place] [phone]               Plan Of Care/Follow-up recommendations:  Activity:  as tolerated Diet:  regular diet  [phone], MD 01/04/2021, 9:34 AM

## 2021-01-04 NOTE — Progress Notes (Signed)
Pt denies SI/HI/AVH. Pt verbalized understanding of follow up appointment, prescription, and discharge instructions. Pt belongings returned from locker. Pt escorted out by staff and transported by family.

## 2021-01-04 NOTE — Progress Notes (Addendum)
Recreation Therapy Notes   Date: 01/04/2021  Time: 10:00 am   Location: Craft room   Behavioral response: Appropriate   Intervention Topic: Coping skills   Discussion/Intervention:  Group content on today was focused on coping skills. The group defined what coping skills are and when they normally use coping skills. Individuals described how they normally cope with thing and the coping skills they normally use. Patients expressed why it is important to cope with things and how not coping with things can affect you. The group participated in the intervention "Exploring coping skills" where they had a chance to test new coping skills they could use in the future.  Clinical Observations/Feedback: Patient came to group and was focused on what peers and staff had to say about coping skills. He stated that his coping skills are based off his preference. Participant explained that coping skills are important to take your mind off things.  Individual was social with peers and staff while participating in intervention.  Meshia Rau LRT/CTRS          Teri Legacy 01/04/2021 12:02 PM

## 2021-01-04 NOTE — Progress Notes (Signed)
  [facility] Adult Case Management Discharge Plan :  Will you be returning to the same living situation after discharge:  Yes,  pt plans to return home. At discharge, do you have transportation home?: Yes,  pt mother to provide transportation. Do you have the ability to pay for your medications: No.  Release of information consent forms completed and in the chart;  Patient's signature needed at discharge.  Patient to Follow up at:  Follow-up Information    [facility] Follow up on 01/07/2021.   Why: Lorella Nimrod with peer support services will pick you up at 7:00AM on 01/07/21. Thanks! Contact information: [address] [place] [address] [phone]               Next level of care provider has access to [facility] Link:no  Safety Planning and Suicide Prevention discussed: Yes,  SPE completed with pt.  Have you used any form of tobacco in the last 30 days? (Cigarettes, Smokeless Tobacco, Cigars, and/or Pipes): Yes  Has patient been referred to the Quitline?: Patient refused referral  Patient has been referred for addiction treatment: Pt. refused referral  Glenis Smoker, LCSW 01/04/2021, 9:18 AM

## 2021-01-04 NOTE — Progress Notes (Signed)
Patient calm and cooperative during assessment denying SI/HI/AVH. Patient observed interacting appropriately with staff and peers on the unit. Patient compliant with medication administration per MD orders. Patient being monitored Q 15 minutes for safety per unit protocol. Pt remains safe on the unit.

## 2021-01-04 NOTE — Discharge Summary (Signed)
Physician Discharge Summary Note  Patient:  Brent Stone is an 30 y.o., male MRN:  [id number] DOB:  06-27-1991 Patient phone:  [phone] (home)  Patient address:   [address] [place] [address],  Total Time spent with patient: 35 minutes- 25 minutes face-to-face contact with patient, 10 minutes documentation, coordination of care, scripts   Date of Admission:  01/02/2021 Date of Discharge: 01/04/2021  Reason for Admission:  Suicide attempt via acetaminophen overdose   Principal Problem: Major depressive disorder, recurrent severe without psychotic features [facility]) Discharge Diagnoses: Principal Problem:   Major depressive disorder, recurrent severe without psychotic features ([place]) Active Problems:   Alcohol use   Acetaminophen overdose, intentional self-harm, initial encounter ([place])   Social anxiety disorder   Past Psychiatric History: No previous psychiatric history.  No medication trials, prior hospital stays, or suicide attempts.  No history of violence.  Past Medical History:  Past Medical History:  Diagnosis Date  . Alcohol use   . Tobacco abuse    History reviewed. No pertinent surgical history. Family History: History reviewed. No pertinent family history. Family Psychiatric  History: Mother with anxiety Social History:  Social History   Substance and Sexual Activity  Alcohol Use Yes  . Alcohol/week: 3.0 standard drinks  . Types: 3 Cans of beer per week   Comment: Three 24oz cans of beer per pt.      Social History   Substance and Sexual Activity  Drug Use Yes  . Types: Marijuana    Social History   Socioeconomic History  . Marital status: Single    Spouse name: Not on file  . Number of children: Not on file  . Years of education: Not on file  . Highest education level: Not on file  Occupational History  . Not on file  Tobacco Use  . Smoking status: Current Every Day Smoker    Packs/day: 1.00    Types: Cigarettes  . Smokeless  tobacco: Never Used  Vaping Use  . Vaping Use: Never used  Substance and Sexual Activity  . Alcohol use: Yes    Alcohol/week: 3.0 standard drinks    Types: 3 Cans of beer per week    Comment: Three 24oz cans of beer per pt.   . Drug use: Yes    Types: Marijuana  . Sexual activity: Yes    Birth control/protection: None  Other Topics Concern  . Not on file  Social History Narrative  . Not on file   Social Determinants of Health   Financial Resource Strain: Not on file  Food Insecurity: Not on file  Transportation Needs: Not on file  Physical Activity: Not on file  Stress: Not on file  Social Connections: Not on file    Hospital Course:  30 year old male who presents after suicide attempt via acetaminophen overdose. He notes that recent interaction with an old girlfriend caused him to question his life and ruminate, and he became suicidal. He impulsively took Tylenol, and immediately regretted his actions. He told his sister, and came to the hospital for treatment. He regrets the decision, and is happy to be alive. On admission he met criteria for MDD and social anxiety disorder. He started Lexapro 10 mg QHS without side effects. At time of discharge he denies suicidal ideations, homicidal ideations, visual hallucinations, or auditory halluciantions.   Physical Findings: AIMS: Facial and Oral Movements Muscles of Facial Expression: None, normal Lips and Perioral Area: None, normal Jaw: None, normal Tongue: None,  normal,Extremity Movements Upper (arms, wrists, hands, fingers): None, normal Lower (legs, knees, ankles, toes): None, normal, Trunk Movements Neck, shoulders, hips: None, normal, Overall Severity Severity of abnormal movements (highest score from questions above): None, normal Incapacitation due to abnormal movements: None, normal Patient's awareness of abnormal movements (rate only patient's report): No Awareness, Dental Status Current problems with teeth and/or  dentures?: No Does patient usually wear dentures?: No  CIWA:  CIWA-Ar Total: 0 COWS:     Musculoskeletal: Strength & Muscle Tone: within normal limits Gait & Station: normal Patient leans: N/A                Psychiatric Specialty Exam:  Presentation  General Appearance: Casual; Appropriate for Environment  Eye Contact:Good  Speech:Normal Rate  Speech Volume:Normal  Handedness:Right   Mood and Affect  Mood:Euthymic  Affect:Congruent   Thought Process  Thought Processes:Coherent; Goal Directed  Descriptions of Associations:Intact  Orientation:Full (Time, Place and Person)  Thought Content:Logical  History of Schizophrenia/Schizoaffective disorder:No  Duration of Psychotic Symptoms:No data recorded Hallucinations:Hallucinations: None  Ideas of Reference:None  Suicidal Thoughts:Suicidal Thoughts: No  Homicidal Thoughts:Homicidal Thoughts: No   Sensorium  Memory:Immediate Good; Recent Good; Remote Good  Judgment:Intact  Insight:Present   Executive Functions  Concentration:Good  Attention Span:Good  [place] of Knowledge:Good  Language:Good   Psychomotor Activity  Psychomotor Activity:Psychomotor Activity: Normal   Assets  Assets:Communication Skills; Desire for Improvement; Housing; Physical Health; Resilience; Social Support; Talents/Skills; Transportation; Vocational/Educational   Sleep  Sleep:Sleep: Good Number of Hours of Sleep: 8    Physical Exam: Physical Exam Vitals and nursing note reviewed.  Constitutional:      Appearance: Normal appearance.  HENT:     Head: Normocephalic and atraumatic.     Right Ear: External ear normal.     Left Ear: External ear normal.     Nose: Nose normal.     Mouth/Throat:     Mouth: Mucous membranes are moist.     Pharynx: Oropharynx is clear.  Eyes:     Extraocular Movements: Extraocular movements intact.     Conjunctiva/sclera: Conjunctivae normal.     Pupils:  Pupils are equal, round, and reactive to light.  Cardiovascular:     Rate and Rhythm: Normal rate.     Pulses: Normal pulses.  Pulmonary:     Effort: Pulmonary effort is normal.     Breath sounds: Normal breath sounds.  Abdominal:     General: Abdomen is flat.     Palpations: Abdomen is soft.  Musculoskeletal:        General: No swelling. Normal range of motion.     Cervical back: Normal range of motion and neck supple.  Skin:    General: Skin is warm and dry.  Neurological:     General: No focal deficit present.     Mental Status: He is alert and oriented to person, place, and time.  Psychiatric:        Mood and Affect: Mood normal.        Behavior: Behavior normal.        Thought Content: Thought content normal.        Judgment: Judgment normal.    Review of Systems  Constitutional: Negative.   HENT: Negative.   Eyes: Negative.   Respiratory: Negative.   Cardiovascular: Negative.   Gastrointestinal: Negative.   Genitourinary: Negative.   Musculoskeletal: Negative.   Skin: Negative.   Neurological: Negative.   Endo/Heme/Allergies: Negative.   Psychiatric/Behavioral: Negative for depression, hallucinations, memory  loss and suicidal ideas. The patient does not have insomnia.    Blood pressure (!) 134/91, pulse 73, temperature 97.9 F (36.6 C), temperature source Oral, resp. rate 18, height 5' 6"  (1.676 m), weight 75.3 kg, SpO2 98 %. Body mass index is 26.79 kg/m.   Have you used any form of tobacco in the last 30 days? (Cigarettes, Smokeless Tobacco, Cigars, and/or Pipes): Yes  Has this patient used any form of tobacco in the last 30 days? (Cigarettes, Smokeless Tobacco, Cigars, and/or Pipes)  Yes, A prescription for an FDA-approved tobacco cessation medication was offered at discharge and the patient refused  Blood Alcohol level:  Lab Results  Component Value Date   ETH 75 (H) 27/10/5007    Metabolic Disorder Labs:  No results found for: HGBA1C, MPG No results  found for: PROLACTIN No results found for: CHOL, TRIG, HDL, CHOLHDL, VLDL, LDLCALC  See Psychiatric Specialty Exam and Suicide Risk Assessment completed by Attending Physician prior to discharge.  Discharge destination:  Home  Is patient on multiple antipsychotic therapies at discharge:  No   Has Patient had three or more failed trials of antipsychotic monotherapy by history:  No  Recommended Plan for Multiple Antipsychotic Therapies: NA  Discharge Instructions    Diet general   Complete by: As directed    Increase activity slowly   Complete by: As directed      Allergies as of 01/04/2021   No Known Allergies     Medication List    STOP taking these medications   ibuprofen 200 MG tablet Commonly known as: ADVIL     TAKE these medications     Indication  escitalopram 10 MG tablet Commonly known as: LEXAPRO Take 1 tablet (10 mg total) by mouth at bedtime.  Indication: Major Depressive Disorder   folic acid 1 MG tablet Commonly known as: FOLVITE Take 1 tablet (1 mg total) by mouth daily.  Indication: Anemia From Inadequate Folic Acid   multivitamin with minerals Tabs tablet Take 1 tablet by mouth daily.  Indication: vitamin deficiency   nicotine 21 mg/24hr patch Commonly known as: NICODERM CQ - dosed in mg/24 hours Place 1 patch (21 mg total) onto the skin daily.  Indication: Nicotine Addiction   thiamine 100 MG tablet Take 1 tablet (100 mg total) by mouth daily.  Indication: Deficiency of Vitamin B1       Follow-up Information    [facility] Follow up on 01/07/2021.   Why: Lanae Boast with peer support services will pick you up at 7:00AM on 01/07/21. Thanks! Contact information: [place] [address] [phone]               Follow-up recommendations:  Activity:  as tolerated Diet:  regular diet  Comments:  7-day supply of free medications provided at discharge along with printed 30-day script with one refill.    Signed: Salley Scarlet, MD 01/04/2021, 9:35 AM
